# Patient Record
Sex: Male | Born: 1995 | Hispanic: No | Marital: Single | Smoking: Never smoker
Health system: Southern US, Community
[De-identification: ages and names within clinical notes are randomized; demographics above are authoritative.]

## PROBLEM LIST (undated history)

## (undated) DIAGNOSIS — F32A Depression, unspecified: Secondary | ICD-10-CM

## (undated) DIAGNOSIS — J45909 Unspecified asthma, uncomplicated: Secondary | ICD-10-CM

## (undated) HISTORY — DX: Depression, unspecified: F32.A

## (undated) HISTORY — DX: Unspecified asthma, uncomplicated: J45.909

---

## 2017-11-07 DIAGNOSIS — M940 Chondrocostal junction syndrome [Tietze]: Secondary | ICD-10-CM | POA: Insufficient documentation

## 2017-11-07 DIAGNOSIS — R300 Dysuria: Secondary | ICD-10-CM | POA: Insufficient documentation

## 2017-11-07 DIAGNOSIS — A539 Syphilis, unspecified: Secondary | ICD-10-CM | POA: Insufficient documentation

## 2017-11-07 DIAGNOSIS — R079 Chest pain, unspecified: Secondary | ICD-10-CM | POA: Insufficient documentation

## 2020-05-08 ENCOUNTER — Emergency Department
Admission: EM | Admit: 2020-05-08 | Discharge: 2020-05-08 | Disposition: A | Discharge status: Home / Self Care | Payer: Commercial Managed Care - PPO | Attending: Student in an Organized Health Care Education/Training Program | Admitting: Student in an Organized Health Care Education/Training Program

## 2020-05-08 ENCOUNTER — Other Ambulatory Visit: Payer: Self-pay

## 2020-05-08 ENCOUNTER — Emergency Department: Payer: Commercial Managed Care - PPO

## 2020-05-08 DIAGNOSIS — R1084 Generalized abdominal pain: Secondary | ICD-10-CM | POA: Insufficient documentation

## 2020-05-08 DIAGNOSIS — R1033 Periumbilical pain: Secondary | ICD-10-CM | POA: Insufficient documentation

## 2020-05-08 DIAGNOSIS — R197 Diarrhea, unspecified: Secondary | ICD-10-CM | POA: Insufficient documentation

## 2020-05-08 DIAGNOSIS — R509 Fever, unspecified: Secondary | ICD-10-CM | POA: Insufficient documentation

## 2020-05-08 DIAGNOSIS — M545 Low back pain, unspecified: Secondary | ICD-10-CM | POA: Diagnosis not present

## 2020-05-08 DIAGNOSIS — Z20822 Contact with and (suspected) exposure to covid-19: Secondary | ICD-10-CM | POA: Insufficient documentation

## 2020-05-08 DIAGNOSIS — K921 Melena: Secondary | ICD-10-CM | POA: Insufficient documentation

## 2020-05-08 LAB — CBC
HCT: 49.2 % (ref 39.0–52.0)
Hemoglobin: 17.2 g/dL — ABNORMAL HIGH (ref 13.0–17.0)
MCH: 31.7 pg (ref 26.0–34.0)
MCHC: 35 g/dL (ref 30.0–36.0)
MCV: 90.6 fL (ref 80.0–100.0)
Platelets: 258 10*3/uL (ref 150–400)
RBC: 5.43 MIL/uL (ref 4.22–5.81)
RDW: 11.8 % (ref 11.5–15.5)
WBC: 6.7 10*3/uL (ref 4.0–10.5)
nRBC: 0 % (ref 0.0–0.2)

## 2020-05-08 LAB — COMPREHENSIVE METABOLIC PANEL
ALT: 26 U/L (ref 0–44)
AST: 22 U/L (ref 15–41)
Albumin: 4.9 g/dL (ref 3.5–5.0)
Alkaline Phosphatase: 63 U/L (ref 38–126)
Anion gap: 8 (ref 5–15)
BUN: 16 mg/dL (ref 6–20)
CO2: 29 mmol/L (ref 22–32)
Calcium: 9.5 mg/dL (ref 8.9–10.3)
Chloride: 101 mmol/L (ref 98–111)
Creatinine, Ser: 0.79 mg/dL (ref 0.61–1.24)
GFR, Estimated: >60 mL/min (ref >60)
Glucose, Bld: 100 mg/dL — ABNORMAL HIGH (ref 70–99)
Potassium: 4.4 mmol/L (ref 3.5–5.1)
Sodium: 138 mmol/L (ref 135–145)
Total Bilirubin: 1.4 mg/dL — ABNORMAL HIGH (ref 0.3–1.2)
Total Protein: 7.8 g/dL (ref 6.5–8.1)

## 2020-05-08 LAB — URINALYSIS, COMPLETE (UACMP) WITH MICROSCOPIC
Bacteria, UA: NONE SEEN
Bilirubin Urine: NEGATIVE
Glucose, UA: NEGATIVE mg/dL
Hgb urine dipstick: NEGATIVE
Ketones, ur: NEGATIVE mg/dL
Leukocytes,Ua: NEGATIVE
Nitrite: NEGATIVE
Protein, ur: NEGATIVE mg/dL
Specific Gravity, Urine: 1.026 (ref 1.005–1.030)
pH: 7 (ref 5.0–8.0)

## 2020-05-08 LAB — RESPIRATORY PANEL BY RT PCR (FLU A&B, COVID)
Influenza A by PCR: NEGATIVE
Influenza B by PCR: NEGATIVE
SARS Coronavirus 2 by RT PCR: NEGATIVE

## 2020-05-08 LAB — LIPASE, BLOOD: Lipase: 30 U/L (ref 11–51)

## 2020-05-08 MED ORDER — ACETAMINOPHEN 500 MG PO TABS
1000.0000 mg | ORAL_TABLET | Freq: Once | ORAL | Status: AC
  Administered 2020-05-08: 1000 mg via ORAL
  Filled 2020-05-08: qty 2

## 2020-05-08 MED ORDER — SODIUM CHLORIDE 0.9 % IV BOLUS
1000.0000 mL | Freq: Once | INTRAVENOUS | Status: AC
  Administered 2020-05-08: 1000 mL via INTRAVENOUS

## 2020-05-08 MED ORDER — IOHEXOL 300 MG/ML  SOLN
100.0000 mL | Freq: Once | INTRAMUSCULAR | Status: AC | PRN
  Administered 2020-05-08: 100 mL via INTRAVENOUS
  Filled 2020-05-08: qty 100

## 2020-05-08 NOTE — Discharge Instructions (Signed)
You have been seen in the emergency department for emergency care. It is important that you contact your own doctor, specialist or the closest clinic for follow-up care. Please bring this instruction sheet, all medications and X-ray copies with you when you are seen for follow-up care.  Determining the exact cause for all patients with abdominal pain is extremely difficult in the emergency department. Our primary focus is to rule-out immediate life-threatening diseases. If no immediate source of pain is found the definitive diagnosis frequently needs to be determined over time.Many times your primary care physician can determine the cause by following the symptoms over time. Sometimes, specialist are required such as Gastroenterologists, Gynecologists, Urologists or Surgeons. Please return immediately to the Emergency Department for fever>101, Vomiting or Intractable Pain. You should return to the emergency department or see your primary care provider in 12-24hrs if your pain is no better and sooner if your pain becomes worse.  You have been seen and diagnosed with back pain today in the Emergency Department.  Medicines: Please take the medicine as prescribed. Call your primary healthcare provider if you think your medicine is not working as expected or if you feel you need more. I do not generally provide refills on pain medications in the ED because I don't monitor you on a long-term basis. Your primary care doctor does. Do not drive or operate heavy machinery while taking narcotic pain medications.  Self-care: Exercise: Gentle exercise may help decrease your pain. Start with some light exercises such as walking, biking, or swimming during the first 2 weeks. Ask for more information about the activities or exercises that are right for you. Maintain a healthy weight.  Ice: Ice helps decrease swelling, pain, and muscle spasm. Put crushed ice in a plastic bag. Cover it with a towel. Place it on your  lower back for 20 to 30 minutes every 2 hours until improvement.  Heat: Heat helps decrease pain and muscle spasms. Use a small towel dampened with warm water or a heat pad, or sit in a warm bath. Apply heat on the area for 20 to 30 minutes every 2 hours until improvement. Alternate heat and ice.  Physical therapy: You may need to see a physical therapist to teach you special exercises. These exercises help improve movement and decrease pain. Physical therapy can also help improve strength and decrease your risk for loss of function. Please follow-up with your PCP about physical therapy.  Contact your primary healthcare provider or orthopedist if:  You have a fever.  You have pain at night or when you rest.  Your pain does not get better with treatment.  You have pain that worsens when you cough, sneeze, or strain your back.  You suddenly feel something pop or snap in your back.  You have questions or concerns about your condition or care.  Please return to the emergency department if:  You have severe pain.  You have sudden stiffness or heaviness to buttocks down to both legs.  You have numbness or weakness in one leg, or pain in both legs.  You have numbness in your genital area or across your lower back.  You cannot control your urine or bowel movements.

## 2020-05-08 NOTE — ED Provider Notes (Signed)
[facility] Emergency Department Provider Note    First MD Initiated Contact with Patient 05/08/20 1215     (approximate)  I have reviewed the triage vital signs and the nursing notes.   HISTORY  Chief Complaint Abdominal Pain    HPI Trevor Bennett is a 24 y.o. male no significant past medical history presents to the ER for evaluation of generalized abdominal pain states primarily suprapubic as well as periumbilical pain radiating through to his back.  Is never had pain like this before.  Has been having multiple episodes of dark stool and diarrhea.  No vomiting.  Thinks that he has had some chills and low-grade fevers at home but no measured temperature.  Was seen at urgent care yesterday for same pain had reassuring work-up Covid was negative and was encouraged to come to the ER for further evaluation.  Patient went home thinking that symptoms would get better but they did continue to worsen so he presented here today.  No previous abdominal surgeries.  No history of stone.    History reviewed. No pertinent past medical history. No family history on file. History reviewed. No pertinent surgical history. There are no problems to display for this patient.     Prior to Admission medications   Not on File    Allergies Nitrates, organic and Oxycodone    Social History Social History   Tobacco Use  . Smoking status: Never Smoker  . Smokeless tobacco: Never Used  Substance Use Topics  . Alcohol use: Yes  . Drug use: Yes    Types: Marijuana    Review of Systems Patient denies headaches, rhinorrhea, blurry vision, numbness, shortness of breath, chest pain, edema, cough, abdominal pain, nausea, vomiting, diarrhea, dysuria, fevers, rashes or hallucinations unless otherwise stated above in HPI. ____________________________________________   PHYSICAL EXAM:  VITAL SIGNS: Vitals:   05/08/20 1051  BP: 125/83  Pulse: 75  Resp: 17  Temp: 98.1 F  (36.7 C)  SpO2: 98%    Constitutional: Alert and oriented.  Eyes: Conjunctivae are normal.  Head: Atraumatic. Nose: No congestion/rhinnorhea. Mouth/Throat: Mucous membranes are moist.   Neck: No stridor. Painless ROM.  Cardiovascular: Normal rate, regular rhythm. Grossly normal heart sounds.  Good peripheral circulation. Respiratory: Normal respiratory effort.  No retractions. Lungs CTAB. Gastrointestinal: Soft with mild periumbilical ttp, no guarding or rebound.  No distention. No abdominal bruits. No CVA tenderness. Genitourinary:  Musculoskeletal: No lower extremity tenderness nor edema.  No joint effusions. Neurologic:  Normal speech and language. No gross focal neurologic deficits are appreciated. No facial droop Skin:  Skin is warm, dry and intact. No rash noted. Psychiatric: Mood and affect are normal. Speech and behavior are normal.  ____________________________________________   LABS (all labs ordered are listed, but only abnormal results are displayed)  Results for orders placed or performed during the hospital encounter of 05/08/20 (from the past 24 hour(s))  Lipase, blood     Status: None   Collection Time: 05/08/20 10:54 AM  Result Value Ref Range   Lipase 30 11 - 51 U/L  Comprehensive metabolic panel     Status: Abnormal   Collection Time: 05/08/20 10:54 AM  Result Value Ref Range   Sodium 138 135 - 145 mmol/L   Potassium 4.4 3.5 - 5.1 mmol/L   Chloride 101 98 - 111 mmol/L   CO2 29 22 - 32 mmol/L   Glucose, Bld 100 (H) 70 - 99 mg/dL   BUN 16 6 - 20 mg/dL  Creatinine, Ser 0.79 0.61 - 1.24 mg/dL   Calcium 9.5 8.9 - 22.2 mg/dL   Total Protein 7.8 6.5 - 8.1 g/dL   Albumin 4.9 3.5 - 5.0 g/dL   AST 22 15 - 41 U/L   ALT 26 0 - 44 U/L   Alkaline Phosphatase 63 38 - 126 U/L   Total Bilirubin 1.4 (H) 0.3 - 1.2 mg/dL   GFR, Estimated >97 >98 mL/min   Anion gap 8 5 - 15  CBC     Status: Abnormal   Collection Time: 05/08/20 10:54 AM  Result Value Ref Range   WBC  6.7 4.0 - 10.5 K/uL   RBC 5.43 4.22 - 5.81 MIL/uL   Hemoglobin 17.2 (H) 13.0 - 17.0 g/dL   HCT 92.1 39 - 52 %   MCV 90.6 80.0 - 100.0 fL   MCH 31.7 26.0 - 34.0 pg   MCHC 35.0 30.0 - 36.0 g/dL   RDW 19.4 17.4 - 08.1 %   Platelets 258 150 - 400 K/uL   nRBC 0.0 0.0 - 0.2 %  Urinalysis, Complete w Microscopic     Status: Abnormal   Collection Time: 05/08/20 10:54 AM  Result Value Ref Range   Color, Urine YELLOW (A) YELLOW   APPearance HAZY (A) CLEAR   Specific Gravity, Urine 1.026 1.005 - 1.030   pH 7.0 5.0 - 8.0   Glucose, UA NEGATIVE NEGATIVE mg/dL   Hgb urine dipstick NEGATIVE NEGATIVE   Bilirubin Urine NEGATIVE NEGATIVE   Ketones, ur NEGATIVE NEGATIVE mg/dL   Protein, ur NEGATIVE NEGATIVE mg/dL   Nitrite NEGATIVE NEGATIVE   Leukocytes,Ua NEGATIVE NEGATIVE   RBC / HPF 0-5 0 - 5 RBC/hpf   WBC, UA 0-5 0 - 5 WBC/hpf   Bacteria, UA NONE SEEN NONE SEEN   Squamous Epithelial / LPF 0-5 0 - 5   Mucus PRESENT    ____________________________________________ ____________________________________________  RADIOLOGY  I personally reviewed all radiographic images ordered to evaluate for the above acute complaints and reviewed radiology reports and findings.  These findings were personally discussed with the patient.  Please see medical record for radiology report.  ____________________________________________   PROCEDURES  Procedure(s) performed:  Procedures    Critical Care performed: no ____________________________________________   INITIAL IMPRESSION / ASSESSMENT AND PLAN / ED COURSE  Pertinent labs & imaging results that were available during my care of the patient were reviewed by me and considered in my medical decision making (see chart for details).   DDX: Appendicitis, cystitis, Pilo, stone, musculoskeletal strain, aneurysm, colitis, IBD, cauda equina, epidural abscess  Trevor Bennett is a 24 y.o. who presents to the ED with presentation as described above.  Patient  uncomfortable nontoxic-appearing.  Will give IV fluids.  Patient declining any pain medication at this time.  Blood work is reassuring but given location of pain with periumbilical pain rating through to his back will order CT imaging due to concern for the but differential.  Will reassess.  Clinical Course as of May 08 1600  Fri May 08, 2020  1441 Patient still quite uncomfortable appearing reporting no family members who that he did have temperature to 101 yesterday.  Will repeat Covid.  Question whether some of his urinary frequency symptoms could be related to CNS pathology.  He is not showing any meningismus.  No complaint of any headache.  Blood work is otherwise reassuring.  CT imaging without any evidence of acute appendicitis but given his low back pain without any history of trauma  with reported fevers at home will order MRI to evaluate for epidural abscess or discitis.   [PR]  1601 Patient reassessed.  He is declining any pain medication.  MRI does not show any evidence of acute process.  I do not have a great explanation for his symptoms.  May simply been musculoskeletal strain.  Given reassuring work-up at this point I do believe he is appropriate for outpatient referral.   [PR]    Clinical Course User Index [PR] Willy Eddy, MD    The patient was evaluated in Emergency Department today for the symptoms described in the history of present illness. He/she was evaluated in the context of the global COVID-19 pandemic, which necessitated consideration that the patient might be at risk for infection with the SARS-CoV-2 virus that causes COVID-19. Institutional protocols and algorithms that pertain to the evaluation of patients at risk for COVID-19 are in a state of rapid change based on information released by regulatory bodies including the CDC and federal and state organizations. These policies and algorithms were followed during the patient's care in the ED.  As part of my medical  decision making, I reviewed the following data within the electronic MEDICAL RECORD NUMBER Nursing notes reviewed and incorporated, Labs reviewed, notes from prior ED visits and [place] Controlled Substance Database   ____________________________________________   FINAL CLINICAL IMPRESSION(S) / ED DIAGNOSES  Final diagnoses:  Periumbilical abdominal pain  Acute midline low back pain without sciatica      NEW MEDICATIONS STARTED DURING THIS VISIT:  New Prescriptions   No medications on file     Note:  This document was prepared using Dragon voice recognition software and may include unintentional dictation errors.    Willy Eddy, MD 05/08/20 [id number]

## 2020-05-08 NOTE — ED Triage Notes (Signed)
Pt c/o lower abd pain that radiates into the back for the past 2 days , was seen at [facility] yesterday  For the same. Denies N/V/Dfever

## 2020-09-29 ENCOUNTER — Encounter: Payer: Self-pay | Admitting: Emergency Medicine

## 2020-09-29 ENCOUNTER — Other Ambulatory Visit: Payer: Self-pay

## 2020-09-29 ENCOUNTER — Emergency Department: Payer: Commercial Managed Care - PPO

## 2020-09-29 ENCOUNTER — Emergency Department
Admission: EM | Admit: 2020-09-29 | Discharge: 2020-09-29 | Disposition: A | Discharge status: Home / Self Care | Payer: Commercial Managed Care - PPO | Attending: Emergency Medicine | Admitting: Emergency Medicine

## 2020-09-29 DIAGNOSIS — M545 Low back pain, unspecified: Secondary | ICD-10-CM | POA: Insufficient documentation

## 2020-09-29 DIAGNOSIS — R112 Nausea with vomiting, unspecified: Secondary | ICD-10-CM | POA: Diagnosis not present

## 2020-09-29 DIAGNOSIS — R3129 Other microscopic hematuria: Secondary | ICD-10-CM | POA: Diagnosis not present

## 2020-09-29 DIAGNOSIS — R1032 Left lower quadrant pain: Secondary | ICD-10-CM | POA: Insufficient documentation

## 2020-09-29 LAB — COMPREHENSIVE METABOLIC PANEL
ALT: 24 U/L (ref 0–44)
AST: 24 U/L (ref 15–41)
Albumin: 4.8 g/dL (ref 3.5–5.0)
Alkaline Phosphatase: 62 U/L (ref 38–126)
Anion gap: 7 (ref 5–15)
BUN: 14 mg/dL (ref 6–20)
CO2: 26 mmol/L (ref 22–32)
Calcium: 9.5 mg/dL (ref 8.9–10.3)
Chloride: 107 mmol/L (ref 98–111)
Creatinine, Ser: 0.76 mg/dL (ref 0.61–1.24)
GFR, Estimated: >60 mL/min (ref >60)
Glucose, Bld: 104 mg/dL — ABNORMAL HIGH (ref 70–99)
Potassium: 4.3 mmol/L (ref 3.5–5.1)
Sodium: 140 mmol/L (ref 135–145)
Total Bilirubin: 1.3 mg/dL — ABNORMAL HIGH (ref 0.3–1.2)
Total Protein: 7.9 g/dL (ref 6.5–8.1)

## 2020-09-29 LAB — CBC
HCT: 48.4 % (ref 39.0–52.0)
Hemoglobin: 16.3 g/dL (ref 13.0–17.0)
MCH: 31.5 pg (ref 26.0–34.0)
MCHC: 33.7 g/dL (ref 30.0–36.0)
MCV: 93.4 fL (ref 80.0–100.0)
Platelets: 247 10*3/uL (ref 150–400)
RBC: 5.18 MIL/uL (ref 4.22–5.81)
RDW: 12.5 % (ref 11.5–15.5)
WBC: 6.1 10*3/uL (ref 4.0–10.5)
nRBC: 0 % (ref 0.0–0.2)

## 2020-09-29 LAB — URINALYSIS, COMPLETE (UACMP) WITH MICROSCOPIC
Bacteria, UA: NONE SEEN
Bilirubin Urine: NEGATIVE
Glucose, UA: NEGATIVE mg/dL
Ketones, ur: NEGATIVE mg/dL
Leukocytes,Ua: NEGATIVE
Nitrite: NEGATIVE
Protein, ur: NEGATIVE mg/dL
Specific Gravity, Urine: 1.018 (ref 1.005–1.030)
pH: 6 (ref 5.0–8.0)

## 2020-09-29 LAB — LIPASE, BLOOD: Lipase: 27 U/L (ref 11–51)

## 2020-09-29 LAB — CK: Total CK: 105 U/L (ref 49–397)

## 2020-09-29 NOTE — ED Triage Notes (Addendum)
Pt via POV from home. Pt c/o lower abdominal pain for about 2 month that radiates to his back. Pt states he went to [facility] and they told him he had blood in his urine and was told to come here. Pt also started having some nausea today but [facility] gave him Zofran and pt denies nausea now. Denies any abdominal surgeries. Denies any other urinary symptoms. Pt is A&Ox4 and NAD.

## 2020-09-29 NOTE — ED Provider Notes (Signed)
[facility] Emergency Department Provider Note  ____________________________________________   Event Date/Time   First MD Initiated Contact with Patient 09/29/20 1228     (approximate)  I have reviewed the triage vital signs and the nursing notes.   HISTORY  Chief Complaint Abdominal Pain, Nausea, and Hematuria    HPI Trevor Bennett is a 25 y.o. male otherwise healthy although does have recurrent UTIs who comes in with left flank pain.  Patient reports having left flank pain for the past 2 weeks, intermittent, nothing makes better, nothing makes it worse.  He states that he came in today because he noted a little bit more discomfort with urination.  He went to the urgent care and there was concern for microscopic blood in his urine.  Denies noticing any hematuria but states he does not drink a lot of water and has noticed it was darker in color.  Denies any fevers.  Did have some nausea and episode of vomiting          History reviewed. No pertinent past medical history.  There are no problems to display for this patient.   History reviewed. No pertinent surgical history.  Prior to Admission medications   Not on File    Allergies Nitrates, organic and Oxycodone  History reviewed. No pertinent family history.  Social History Social History   Tobacco Use  . Smoking status: Never Smoker  . Smokeless tobacco: Never Used  Substance Use Topics  . Alcohol use: Yes  . Drug use: Yes    Types: Marijuana      Review of Systems Constitutional: No fever/chills Eyes: No visual changes. ENT: No sore throat. Cardiovascular: Denies chest pain. Respiratory: Denies shortness of breath. Gastrointestinal: Left lower quadrant pain, nausea, vomiting, Genitourinary: Mild discomfort with urination Musculoskeletal: Left back pain Skin: Negative for rash. Neurological: Negative for headaches, focal weakness or numbness. All other ROS  negative ____________________________________________   PHYSICAL EXAM:  VITAL SIGNS: ED Triage Vitals [09/29/20 1207]  Enc Vitals Group     BP (!) 130/95     Pulse Rate 87     Resp 20     Temp 98.4 F (36.9 C)     Temp Source Oral     SpO2 100 %     Weight 142 lb (64.4 kg)     Height 5\' 8"  (1.727 m)     Head Circumference      Peak Flow      Pain Score 5     Pain Loc      Pain Edu?      Excl. in GC?     Constitutional: Alert and oriented. Well appearing and in no acute distress. Eyes: Conjunctivae are normal. EOMI. Head: Atraumatic. Nose: No congestion/rhinnorhea. Mouth/Throat: Mucous membranes are moist.   Neck: No stridor. Trachea Midline. FROM Cardiovascular: Normal rate, regular rhythm. Grossly normal heart sounds.  Good peripheral circulation. Respiratory: Normal respiratory effort.  No retractions. Lungs CTAB. Gastrointestinal: Soft with some mild left lower quadrant tenderness. No distention. No abdominal bruits.  Musculoskeletal: No lower extremity tenderness nor edema.  No joint effusions. Neurologic:  Normal speech and language. No gross focal neurologic deficits are appreciated.  Skin:  Skin is warm, dry and intact. No rash noted. Psychiatric: Mood and affect are normal. Speech and behavior are normal. GU: No erythema or redness on the testicles, circumcised penis, positive cremaster reflex bilaterally Back: Positive CVA tenderness ____________________________________________   LABS (all labs ordered are listed, but only  abnormal results are displayed)  Labs Reviewed  COMPREHENSIVE METABOLIC PANEL - Abnormal; Notable for the following components:      Result Value   Glucose, Bld 104 (*)    Total Bilirubin 1.3 (*)    All other components within normal limits  URINALYSIS, COMPLETE (UACMP) WITH MICROSCOPIC - Abnormal; Notable for the following components:   Color, Urine YELLOW (*)    APPearance CLEAR (*)    Hgb urine dipstick SMALL (*)    All other  components within normal limits  LIPASE, BLOOD  CBC  CK   ____________________________________________  RADIOLOGY Vela Prose, personally viewed and evaluated these images (plain radiographs) as part of my medical decision making, as well as reviewing the written report by the radiologist.  ED MD interpretation: No kidney stones noted  Official radiology report(s): CT Renal Stone Study  Result Date: 09/29/2020 CLINICAL DATA:  Hematuria. EXAM: CT ABDOMEN AND PELVIS WITHOUT CONTRAST TECHNIQUE: Multidetector CT imaging of the abdomen and pelvis was performed following the standard protocol without IV contrast. COMPARISON:  05/08/2020 FINDINGS: Lower chest: No acute abnormality. Hepatobiliary: Visualized portions of the liver are unremarkable. Gallbladder appears normal. No biliary dilatation. Pancreas: Unremarkable. No pancreatic ductal dilatation or surrounding inflammatory changes. Spleen: Normal in size without focal abnormality. Adrenals/Urinary Tract: Normal appearance of the adrenal glands. The kidneys are unremarkable. No urinary tract calculi identified bilaterally. No signs of hydronephrosis. Urinary bladder is unremarkable. Stomach/Bowel: Stomach appears normal. The appendix is visualized and appears normal. No bowel wall thickening, inflammation, or distension. Vascular/Lymphatic: No significant vascular findings are present. No enlarged abdominal or pelvic lymph nodes. Reproductive: Prostate is unremarkable. Other: No free fluid or fluid collections. Musculoskeletal: No acute or significant osseous findings. IMPRESSION: No acute findings within the abdomen or pelvis. No urinary tract calculi identified. Electronically Signed   By: Signa Kell M.D.   On: 09/29/2020 13:26    ____________________________________________   PROCEDURES  Procedure(s) performed (including Critical Care):  Procedures   ____________________________________________   INITIAL IMPRESSION /  ASSESSMENT AND PLAN / ED COURSE  Trevor Bennett was evaluated in Emergency Department on 09/29/2020 for the symptoms described in the history of present illness. He was evaluated in the context of the global COVID-19 pandemic, which necessitated consideration that the patient might be at risk for infection with the SARS-CoV-2 virus that causes COVID-19. Institutional protocols and algorithms that pertain to the evaluation of patients at risk for COVID-19 are in a state of rapid change based on information released by regulatory bodies including the CDC and federal and state organizations. These policies and algorithms were followed during the patient's care in the ED.    Patient is a 25 year old who comes in with blood in his urine has microscopic as well as left flank pain is been going on for 2 months.  Will get CT renal evaluate for kidney stone, obstruction, colitis.  No right lower quadrant pain to suggest appendicitis.  Labs ordered to evaluate Electra abnormalities, AKI.  His repeat urine here shows small hemoglobin but 0-5 RBCs.  Will add on CK to evaluate for rhabdo  No evidence of UTI patient already had gonorrhea and chlamydia swabs that are pending from urgent care.  Declines retest here.  Patient states he feels like he is getting good amounts of urine out and low suspicion for retention at this time.  He does report having unprotected sex 1 time and explained that he needs to follow-up with the urgent care testing.  He already  had HIV test that was negative.  Labs show a slightly elevated T bili similar to prior.  I discussed this with patient and told her to follow-up with PCP and cut down alcohol if he drinks alcohol.  CK level was normal therefore evidence of rhabdo.  Discussed with patient that his CT scan was negative.  His urine showed small amount of hemoglobin in it.  We discussed increasing fluid intake and having this rechecked with his primary care doctor in 1 to 2 weeks.  If  continues to have blood in it he will need to follow-up with urology for further work-up.  Patient expressed understanding felt comfortable with discharge home       ____________________________________________   FINAL CLINICAL IMPRESSION(S) / ED DIAGNOSES   Final diagnoses:  Low back pain without sciatica, unspecified back pain laterality, unspecified chronicity  Microscopic hematuria      MEDICATIONS GIVEN DURING THIS VISIT:  Medications - No data to display   ED Discharge Orders    None       Note:  This document was prepared using Dragon voice recognition software and may include unintentional dictation errors.   Concha Se, MD 09/29/20 1410

## 2020-09-29 NOTE — Discharge Instructions (Signed)
Your urine showed a small amount of hemoglobin and it.  However your CT scan was negative for kidney stone and there is no signs of something called rhabdomyolysis.  You should however drink plenty of fluids.  Your liver function test was also slightly elevated which is similar to previous.  Nothing that is causing your pain but something that you should follow-up with your primary care doctor and if you drink alcohol you should cut down.  You should follow-up with your primary care doctor in a few weeks to have your urine retested to make sure that there is no longer any blood in it.  If he continues to have blood in it he may need to follow-up with a urologist  Take Tylenol 1 g every 8 hours and ibuprofen 600 with food to help with any pain

## 2020-11-12 ENCOUNTER — Other Ambulatory Visit: Payer: Self-pay

## 2020-11-17 ENCOUNTER — Other Ambulatory Visit: Payer: Self-pay

## 2020-11-17 ENCOUNTER — Other Ambulatory Visit (INDEPENDENT_AMBULATORY_CARE_PROVIDER_SITE_OTHER): Payer: Commercial Managed Care - PPO

## 2020-11-17 ENCOUNTER — Encounter: Payer: Self-pay | Admitting: Adult Health

## 2020-11-17 ENCOUNTER — Ambulatory Visit: Payer: Commercial Managed Care - PPO | Admitting: Adult Health

## 2020-11-17 VITALS — BP 108/64 | HR 93 | Temp 97.5°F | Resp 14 | Ht 67.0 in | Wt 140.4 lb

## 2020-11-17 DIAGNOSIS — Z87448 Personal history of other diseases of urinary system: Secondary | ICD-10-CM | POA: Diagnosis not present

## 2020-11-17 DIAGNOSIS — Z87440 Personal history of urinary (tract) infections: Secondary | ICD-10-CM | POA: Diagnosis not present

## 2020-11-17 DIAGNOSIS — R5383 Other fatigue: Secondary | ICD-10-CM

## 2020-11-17 DIAGNOSIS — Z87898 Personal history of other specified conditions: Secondary | ICD-10-CM

## 2020-11-17 LAB — COMPREHENSIVE METABOLIC PANEL
ALT: 19 U/L (ref 0–53)
AST: 19 U/L (ref 0–37)
Albumin: 4.4 g/dL (ref 3.5–5.2)
Alkaline Phosphatase: 68 U/L (ref 39–117)
BUN: 17 mg/dL (ref 6–23)
CO2: 30 mEq/L (ref 19–32)
Calcium: 9.4 mg/dL (ref 8.4–10.5)
Chloride: 101 mEq/L (ref 96–112)
Creatinine, Ser: 0.79 mg/dL (ref 0.40–1.50)
GFR: 124.21 mL/min (ref >60.00)
Glucose, Bld: 84 mg/dL (ref 70–99)
Potassium: 4.3 mEq/L (ref 3.5–5.1)
Sodium: 138 mEq/L (ref 135–145)
Total Bilirubin: 0.9 mg/dL (ref 0.2–1.2)
Total Protein: 7.3 g/dL (ref 6.0–8.3)

## 2020-11-17 LAB — CBC WITH DIFFERENTIAL/PLATELET
Basophils Absolute: 0 10*3/uL (ref 0.0–0.1)
Basophils Relative: 0.4 % (ref 0.0–3.0)
Eosinophils Absolute: 0.2 10*3/uL (ref 0.0–0.7)
Eosinophils Relative: 2.8 % (ref 0.0–5.0)
HCT: 47.3 % (ref 39.0–52.0)
Hemoglobin: 16.1 g/dL (ref 13.0–17.0)
Lymphocytes Relative: 28.5 % (ref 12.0–46.0)
Lymphs Abs: 1.9 10*3/uL (ref 0.7–4.0)
MCHC: 34.1 g/dL (ref 30.0–36.0)
MCV: 91.7 fl (ref 78.0–100.0)
Monocytes Absolute: 0.7 10*3/uL (ref 0.1–1.0)
Monocytes Relative: 10.3 % (ref 3.0–12.0)
Neutro Abs: 3.8 10*3/uL (ref 1.4–7.7)
Neutrophils Relative %: 58 % (ref 43.0–77.0)
Platelets: 295 10*3/uL (ref 150.0–400.0)
RBC: 5.16 Mil/uL (ref 4.22–5.81)
RDW: 12.6 % (ref 11.5–15.5)
WBC: 6.6 10*3/uL (ref 4.0–10.5)

## 2020-11-17 LAB — URINALYSIS
Bilirubin Urine: NEGATIVE
Ketones, ur: NEGATIVE
Leukocytes,Ua: NEGATIVE
Nitrite: NEGATIVE
Specific Gravity, Urine: 1.015 (ref 1.000–1.030)
Total Protein, Urine: NEGATIVE
Urine Glucose: NEGATIVE
Urobilinogen, UA: 0.2 (ref 0.0–1.0)
pH: 8.5 — AB (ref 5.0–8.0)

## 2020-11-17 LAB — LIPID PANEL
Cholesterol: 157 mg/dL (ref 0–200)
HDL: 50.1 mg/dL (ref >39.00)
LDL Cholesterol: 90 mg/dL (ref 0–99)
NonHDL: 106.85
Total CHOL/HDL Ratio: 3
Triglycerides: 85 mg/dL (ref 0.0–149.0)
VLDL: 17 mg/dL (ref 0.0–40.0)

## 2020-11-17 LAB — URINALYSIS, MICROSCOPIC ONLY: RBC / HPF: NONE SEEN (ref 0–?)

## 2020-11-17 LAB — TSH: TSH: 1.75 u[IU]/mL (ref 0.35–4.50)

## 2020-11-17 NOTE — Patient Instructions (Signed)
Hematuria, Adult Hematuria is blood in the urine. Blood may be visible in the urine, or it may be identified with a test. This condition can be caused by infections of the bladder, urethra, kidney, or prostate. Other possible causes include:  Kidney stones.  Cancer of the urinary tract.  Too much calcium in the urine.  Conditions that are passed from parent to child (inherited conditions).  Exercise that requires a lot of energy. Infections can usually be treated with medicine, and a kidney stone usually will pass through your urine. If neither of these is the cause of your hematuria, more tests may be needed to identify the cause of your symptoms. It is very important to tell your health care provider about any blood in your urine, even if it is painless or the blood stops without treatment. Blood in the urine, when it happens and then stops and then happens again, can be a symptom of a very serious condition, including cancer. There is no pain in the initial stages of many urinary cancers. Follow these instructions at home: Medicines  Take over-the-counter and prescription medicines only as told by your health care provider.  If you were prescribed an antibiotic medicine, take it as told by your health care provider. Do not stop taking the antibiotic even if you start to feel better. Eating and drinking  Drink enough fluid to keep your urine pale yellow. It is recommended that you drink 3-4 quarts (2.8-3.8 L) a day. If you have been diagnosed with an infection, drinking cranberry juice in addition to large amounts of water is recommended.  Avoid caffeine, tea, and carbonated beverages. These tend to irritate the bladder.  Avoid alcohol because it may irritate the prostate (in males). General instructions  If you have been diagnosed with a kidney stone, follow your health care provider's instructions about straining your urine to catch the stone.  Empty your bladder often. Avoid  holding urine for long periods of time.  If you are male: ? After a bowel movement, wipe from front to back and use each piece of toilet paper only once. ? Empty your bladder before and after sex.  Pay attention to any changes in your symptoms. Tell your health care provider about any changes or any new symptoms.  It is up to you to get the results of any tests. Ask your health care provider, or the department that is doing the test, when your results will be ready.  Keep all follow-up visits. This is important. Contact a health care provider if:  You develop back pain.  You have a fever or chills.  You have nausea or vomiting.  Your symptoms do not improve after 3 days.  Your symptoms get worse. Get help right away if:  You develop severe vomiting and are unable to take medicine without vomiting.  You develop severe pain in your back or abdomen even though you are taking medicine.  You pass a large amount of blood in your urine.  You pass blood clots in your urine.  You feel very weak or like you might faint.  You faint. Summary  Hematuria is blood in the urine. It has many possible causes.  It is very important that you tell your health care provider about any blood in your urine, even if it is painless or the blood stops without treatment.  Take over-the-counter and prescription medicines only as told by your health care provider.  Drink enough fluid to keep your urine pale   yellow. This information is not intended to replace advice given to you by your health care provider. Make sure you discuss any questions you have with your health care provider. Document Revised: 03/04/2020 Document Reviewed: 03/04/2020 Elsevier Patient Education  2021 [employer]. Health Maintenance, Male Adopting a healthy lifestyle and getting preventive care are important in promoting health and wellness. Ask your health care provider about:  The right schedule for you to have regular  tests and exams.  Things you can do on your own to prevent diseases and keep yourself healthy. What should I know about diet, weight, and exercise? Eat a healthy diet  Eat a diet that includes plenty of vegetables, fruits, low-fat dairy products, and lean protein.  Do not eat a lot of foods that are high in solid fats, added sugars, or sodium.   Maintain a healthy weight Body mass index (BMI) is a measurement that can be used to identify possible weight problems. It estimates body fat based on height and weight. Your health care provider can help determine your BMI and help you achieve or maintain a healthy weight. Get regular exercise Get regular exercise. This is one of the most important things you can do for your health. Most adults should:  Exercise for at least 150 minutes each week. The exercise should increase your heart rate and make you sweat (moderate-intensity exercise).  Do strengthening exercises at least twice a week. This is in addition to the moderate-intensity exercise.  Spend less time sitting. Even light physical activity can be beneficial. Watch cholesterol and blood lipids Have your blood tested for lipids and cholesterol at 25 years of age, then have this test every 5 years. You may need to have your cholesterol levels checked more often if:  Your lipid or cholesterol levels are high.  You are older than 25 years of age.  You are at high risk for heart disease. What should I know about cancer screening? Many types of cancers can be detected early and may often be prevented. Depending on your health history and family history, you may need to have cancer screening at various ages. This may include screening for:  Colorectal cancer.  Prostate cancer.  Skin cancer.  Lung cancer. What should I know about heart disease, diabetes, and high blood pressure? Blood pressure and heart disease  High blood pressure causes heart disease and increases the risk of  stroke. This is more likely to develop in people who have high blood pressure readings, are of African descent, or are overweight.  Talk with your health care provider about your target blood pressure readings.  Have your blood pressure checked: ? Every 3-5 years if you are 49-69 years of age. ? Every year if you are 25 years old or older.  If you are between the ages of 62 and 48 and are a current or former smoker, ask your health care provider if you should have a one-time screening for abdominal aortic aneurysm (AAA). Diabetes Have regular diabetes screenings. This checks your fasting blood sugar level. Have the screening done:  Once every three years after age 64 if you are at a normal weight and have a low risk for diabetes.  More often and at a younger age if you are overweight or have a high risk for diabetes. What should I know about preventing infection? Hepatitis B If you have a higher risk for hepatitis B, you should be screened for this virus. Talk with your health care provider to  find out if you are at risk for hepatitis B infection. Hepatitis C Blood testing is recommended for:  Everyone born from [birth year] through 1965.  Anyone with known risk factors for hepatitis C. Sexually transmitted infections (STIs)  You should be screened each year for STIs, including gonorrhea and chlamydia, if: ? You are sexually active and are younger than 25 years of age. ? You are older than 25 years of age and your health care provider tells you that you are at risk for this type of infection. ? Your sexual activity has changed since you were last screened, and you are at increased risk for chlamydia or gonorrhea. Ask your health care provider if you are at risk.  Ask your health care provider about whether you are at high risk for HIV. Your health care provider may recommend a prescription medicine to help prevent HIV infection. If you choose to take medicine to prevent HIV, you should first  get tested for HIV. You should then be tested every 3 months for as long as you are taking the medicine. Follow these instructions at home: Lifestyle  Do not use any products that contain nicotine or tobacco, such as cigarettes, e-cigarettes, and chewing tobacco. If you need help quitting, ask your health care provider.  Do not use street drugs.  Do not share needles.  Ask your health care provider for help if you need support or information about quitting drugs. Alcohol use  Do not drink alcohol if your health care provider tells you not to drink.  If you drink alcohol: ? Limit how much you have to 0-2 drinks a day. ? Be aware of how much alcohol is in your drink. In the [place] one drink equals one 12 oz bottle of beer (355 mL), one 5 oz glass of wine (148 mL), or one 1 oz glass of hard liquor (44 mL). General instructions  Schedule regular health, dental, and eye exams.  Stay current with your vaccines.  Tell your health care provider if: ? You often feel depressed. ? You have ever been abused or do not feel safe at home. Summary  Adopting a healthy lifestyle and getting preventive care are important in promoting health and wellness.  Follow your health care provider's instructions about healthy diet, exercising, and getting tested or screened for diseases.  Follow your health care provider's instructions on monitoring your cholesterol and blood pressure. This information is not intended to replace advice given to you by your health care provider. Make sure you discuss any questions you have with your health care provider. Document Revised: 06/27/2018 Document Reviewed: 06/27/2018 Elsevier Patient Education  2021 [employer].

## 2020-11-17 NOTE — Addendum Note (Signed)
Addended by: Warden Fillers on: 11/17/2020 02:14 PM   Modules accepted: Orders

## 2020-11-17 NOTE — Progress Notes (Unsigned)
urine

## 2020-11-17 NOTE — Progress Notes (Signed)
New Patient Office Visit  Subjective:  Patient ID: Trevor Bennett, male    DOB: [birth year]  Age: 25 y.o. MRN: [id number]  CC:  Chief Complaint  Patient presents with  . Establish Care    New Patient    HPI Trevor Bennett presents for establishment of care. He reports he has had history of urinary tract infections, reports has had 3 since the past 2 years. He has not had any since breaking up with his girlfriend. Denies any pain with urination. Denies any problems with rectal pain, pressure or with getting an erection and or ejaculation.   He denies any need for STD testing.  Last UTI was October 2021 and was treated and resolved.  Provider does see small amount of blood in urine one month ago in emergency room. CT renal study was negative.   Denies any urinary symptoms now. Denies any gross hematuria.   Patient  denies any fever, body aches,chills, rash, chest pain, shortness of breath, nausea, vomiting, or diarrhea.  Denies dizziness, lightheadedness, pre syncopal or syncopal episodes.    Past Medical History:  Diagnosis Date  . Asthma   . Depression     History reviewed. No pertinent surgical history.  Family History  Problem Relation Age of Onset  . Alcohol abuse Father   . Heart attack Father     Social History   Socioeconomic History  . Marital status: Single    Spouse name: Not on file  . Number of children: Not on file  . Years of education: Not on file  . Highest education level: Not on file  Occupational History  . Not on file  Tobacco Use  . Smoking status: Never Smoker  . Smokeless tobacco: Never Used  Substance and Sexual Activity  . Alcohol use: Yes  . Drug use: Yes    Types: Marijuana  . Sexual activity: Yes    Comment: with male and male  Other Topics Concern  . Not on file  Social History Narrative  . Not on file   Social Determinants of Health   Financial Resource Strain: Not on file  Food Insecurity: Not on file  Transportation Needs:  Not on file  Physical Activity: Not on file  Stress: Not on file  Social Connections: Not on file  Intimate Partner Violence: Not on file    ROS Review of Systems  Constitutional: Negative.   HENT: Negative.   Respiratory: Negative.   Cardiovascular: Negative.   Genitourinary: Negative.   Musculoskeletal: Negative.   Neurological: Negative.   Hematological: Negative.   Psychiatric/Behavioral: Negative.     Objective:   Today's Vitals: BP 108/64 (BP Location: Left Arm, Patient Position: Sitting, Cuff Size: Normal)   Pulse 93   Temp (!) 97.5 F (36.4 C) (Temporal)   Resp 14   Ht 5\' 7"  (1.702 m)   Wt 140 lb 6.4 oz (63.7 kg)   SpO2 97%   BMI 21.99 kg/m   Physical Exam Vitals and nursing note reviewed.  Constitutional:      Appearance: Normal appearance. He is not ill-appearing or toxic-appearing.  HENT:     Head: Normocephalic and atraumatic.     Right Ear: External ear normal.     Left Ear: External ear normal.     Nose: Nose normal.     Mouth/Throat:     Mouth: Mucous membranes are moist.     Pharynx: No oropharyngeal exudate or posterior oropharyngeal erythema.  Eyes:     General: No  scleral icterus.       Right eye: No discharge.        Left eye: No discharge.     Extraocular Movements: Extraocular movements intact.     Conjunctiva/sclera: Conjunctivae normal.  Cardiovascular:     Rate and Rhythm: Normal rate and regular rhythm.     Pulses: Normal pulses.     Heart sounds: Normal heart sounds. No murmur heard. No friction rub. No gallop.   Pulmonary:     Effort: Pulmonary effort is normal. No respiratory distress.     Breath sounds: Normal breath sounds. No stridor. No wheezing, rhonchi or rales.  Chest:     Chest wall: No tenderness.  Abdominal:     General: There is no distension.     Palpations: Abdomen is soft.     Tenderness: There is no abdominal tenderness.  Musculoskeletal:        General: Normal range of motion.     Cervical back: Normal  range of motion and neck supple.     Right lower leg: No edema.     Left lower leg: No edema.  Skin:    General: Skin is warm.     Findings: No lesion.  Neurological:     General: No focal deficit present.     Mental Status: He is alert and oriented to person, place, and time.     Motor: No weakness.     Gait: Gait normal.  Psychiatric:        Mood and Affect: Mood normal.        Behavior: Behavior normal.        Thought Content: Thought content normal.        Judgment: Judgment normal.     Assessment & Plan:   Problem List Items Addressed This Visit   None   Visit Diagnoses    History of UTI    -  Primary   Relevant Orders   Urinalysis   CULTURE, URINE COMPREHENSIVE   History of blood in urine       Relevant Orders   Urinalysis   CULTURE, URINE COMPREHENSIVE   Fatigue, unspecified type       Relevant Orders   CBC with Differential/Platelet   Comprehensive metabolic panel   TSH   Lipid panel     Orders Placed This Encounter  Procedures  . CULTURE, URINE COMPREHENSIVE  . CBC with Differential/Platelet  . Comprehensive metabolic panel  . TSH  . Lipid panel  . Urinalysis  will check microscopic urine and culture. Urology referral is appropriate if still showing blood. Declined any STD testing.   Red Flags discussed. The patient was given clear instructions to go to ER or return to medical center if any red flags develop, symptoms do not improve, worsen or new problems develop. They verbalized understanding.  .  No outpatient encounter medications on file as of 11/17/2020.   No facility-administered encounter medications on file as of 11/17/2020.    Follow-up: Return in about 3 months (around 02/17/2021), or if symptoms worsen or fail to improve, for Go to Emergency room/ urgent care if worse.   Jairo Ben, FNP

## 2020-11-17 NOTE — Addendum Note (Signed)
Addended by: Hulan Fray on: 11/17/2020 01:41 PM   Modules accepted: Orders

## 2020-11-18 ENCOUNTER — Telehealth: Payer: Self-pay | Admitting: Adult Health

## 2020-11-18 NOTE — Telephone Encounter (Signed)
Returned the call and spoke to [employer]. See result notes.

## 2020-11-18 NOTE — Progress Notes (Signed)
CMP and CMP is within normal limits.  Cholesterol within normal limits.  TSH for thyroid within normal limits.  Urinalysis did show trace hemoglobin/ blood in urine and urine microscopic and urine culture are still pending we will wait for those results.

## 2020-11-18 NOTE — Telephone Encounter (Signed)
Pt called back about lab results.  

## 2020-11-19 LAB — CULTURE, URINE COMPREHENSIVE
MICRO NUMBER:: 11843730
RESULT:: NO GROWTH
SPECIMEN QUALITY:: ADEQUATE

## 2020-11-19 NOTE — Progress Notes (Signed)
Urine culture still in progress as of 11/19/2020

## 2020-11-23 ENCOUNTER — Other Ambulatory Visit: Payer: Self-pay | Admitting: Adult Health

## 2020-11-23 DIAGNOSIS — R311 Benign essential microscopic hematuria: Secondary | ICD-10-CM | POA: Insufficient documentation

## 2020-11-23 NOTE — Progress Notes (Signed)
Orders Placed This Encounter Procedures  Ambulatory referral to Urology Placed order for urology consult, hematuria history on microscopic and no infection noted.  He should hear from urologist within 2 weeks and if not please call the office.

## 2020-11-23 NOTE — Progress Notes (Signed)
Orders Placed This Encounter  Procedures  . Ambulatory referral to Urology

## 2020-12-16 ENCOUNTER — Ambulatory Visit: Payer: Self-pay | Admitting: Urology

## 2020-12-16 NOTE — Progress Notes (Deleted)
12/16/2020 8:18 AM   Trevor Bennett [birth year] [id number]  Referring provider: Berniece Pap, FNP [address]  [place] [address]  No chief complaint on file.   HPI: Trevor Bennett is a 25 y.o. male referred for evaluation of hematuria.   Saw PCP 11/17/2020  History of 3 UTIs in 2 years though on  record review UAs October 2021 and March 2022  Dipstick urinalysis 11/17/2020 with trace intact blood; urine culture no growth  ED visit 09/29/2020 with left flank pain, nausea and vomiting; CT renal stone study was unremarkable   PMH: Past Medical History:  Diagnosis Date  . Asthma   . Depression     Surgical History: No past surgical history on file.  Home Medications:  Allergies as of 12/16/2020      Reactions   Nitrates, Organic Anaphylaxis   Oxycodone Anaphylaxis   Amoxicillin    Other reaction(s): abdominal pain or cramping, nausea, vomiting ()   Nitrous Oxide       Medication List    as of December 16, 2020  8:18 AM   You have not been prescribed any medications.     Allergies:  Allergies  Allergen Reactions  . Nitrates, Organic Anaphylaxis  . Oxycodone Anaphylaxis  . Amoxicillin     Other reaction(s): abdominal pain or cramping, nausea, vomiting ()  . Nitrous Oxide     Family History: Family History  Problem Relation Age of Onset  . Alcohol abuse Father   . Heart attack Father     Social History:  reports that he has never smoked. He has never used smokeless tobacco. He reports current alcohol use. He reports current drug use. Drug: Marijuana.   Physical Exam: There were no vitals taken for this visit.  Constitutional:  Alert and oriented, No acute distress. HEENT: [place] AT, moist mucus membranes.  Trachea midline, no masses. Cardiovascular: No clubbing, cyanosis, or edema. Respiratory: Normal respiratory effort, no increased work of breathing. GI: Abdomen is soft, nontender, nondistended, no abdominal masses GU: No CVA  tenderness Lymph: No cervical or inguinal lymphadenopathy. Skin: No rashes, bruises or suspicious lesions. Neurologic: Grossly intact, no focal deficits, moving all 4 extremities. Psychiatric: Normal mood and affect.  Laboratory Data: Lab Results  Component Value Date   WBC 6.6 11/17/2020   HGB 16.1 11/17/2020   HCT 47.3 11/17/2020   MCV 91.7 11/17/2020   PLT 295.0 11/17/2020    Lab Results  Component Value Date   CREATININE 0.79 11/17/2020    No results found for: PSA  No results found for: TESTOSTERONE  No results found for: HGBA1C  Urinalysis    Component Value Date/Time   COLORURINE YELLOW 11/17/2020 1057   APPEARANCEUR CLEAR 11/17/2020 1057   LABSPEC 1.015 11/17/2020 1057   PHURINE 8.5 (A) 11/17/2020 1057   GLUCOSEU NEGATIVE 11/17/2020 1057   HGBUR TRACE-INTACT (A) 11/17/2020 1057   BILIRUBINUR NEGATIVE 11/17/2020 1057   KETONESUR NEGATIVE 11/17/2020 1057   PROTEINUR NEGATIVE 09/29/2020 1210   UROBILINOGEN 0.2 11/17/2020 1057   NITRITE NEGATIVE 11/17/2020 1057   LEUKOCYTESUR NEGATIVE 11/17/2020 1057    Lab Results  Component Value Date   BACTERIA NONE SEEN 09/29/2020    Pertinent Imaging: *** No results found for this or any previous visit.  No results found for this or any previous visit.  No results found for this or any previous visit.  No results found for this or any previous visit.  No results found for this or any  previous visit.  No results found for this or any previous visit.  No results found for this or any previous visit.  Results for orders placed during the hospital encounter of 09/29/20  CT Renal Stone Study  Narrative CLINICAL DATA:  Hematuria.  EXAM: CT ABDOMEN AND PELVIS WITHOUT CONTRAST  TECHNIQUE: Multidetector CT imaging of the abdomen and pelvis was performed following the standard protocol without IV contrast.  COMPARISON:  05/08/2020  FINDINGS: Lower chest: No acute abnormality.  Hepatobiliary:  Visualized portions of the liver are unremarkable. Gallbladder appears normal. No biliary dilatation.  Pancreas: Unremarkable. No pancreatic ductal dilatation or surrounding inflammatory changes.  Spleen: Normal in size without focal abnormality.  Adrenals/Urinary Tract: Normal appearance of the adrenal glands. The kidneys are unremarkable. No urinary tract calculi identified bilaterally. No signs of hydronephrosis. Urinary bladder is unremarkable.  Stomach/Bowel: Stomach appears normal. The appendix is visualized and appears normal. No bowel wall thickening, inflammation, or distension.  Vascular/Lymphatic: No significant vascular findings are present. No enlarged abdominal or pelvic lymph nodes.  Reproductive: Prostate is unremarkable.  Other: No free fluid or fluid collections.  Musculoskeletal: No acute or significant osseous findings.  IMPRESSION: No acute findings within the abdomen or pelvis. No urinary tract calculi identified.   Electronically Signed By: Signa Kell M.D. On: 09/29/2020 13:26   Assessment & Plan:    There are no diagnoses linked to this encounter.  No follow-ups on file.  [name], MD  [facility] [address], [address] [place], [place] [address] [phone]

## 2020-12-18 ENCOUNTER — Encounter: Payer: Self-pay | Admitting: Urology

## 2021-02-17 ENCOUNTER — Ambulatory Visit: Payer: Commercial Managed Care - PPO | Admitting: Adult Health

## 2021-03-04 ENCOUNTER — Encounter: Payer: Self-pay | Admitting: Adult Health

## 2021-04-06 ENCOUNTER — Ambulatory Visit: Payer: Commercial Managed Care - PPO | Admitting: Adult Health

## 2021-04-13 ENCOUNTER — Ambulatory Visit: Payer: Self-pay | Admitting: Physician Assistant

## 2021-04-13 ENCOUNTER — Other Ambulatory Visit: Payer: Self-pay

## 2021-04-13 DIAGNOSIS — Z113 Encounter for screening for infections with a predominantly sexual mode of transmission: Secondary | ICD-10-CM

## 2021-04-13 DIAGNOSIS — A539 Syphilis, unspecified: Secondary | ICD-10-CM

## 2021-04-13 MED ORDER — DOXYCYCLINE HYCLATE 100 MG PO TABS: 100.0000 mg | ORAL_TABLET | Freq: Two times a day (BID) | ORAL | 0 refills | Status: AC

## 2021-04-15 ENCOUNTER — Encounter: Payer: Self-pay | Admitting: Physician Assistant

## 2021-04-15 NOTE — Progress Notes (Signed)
Chart reviewed by Pharmacist  Suzanne Walker PharmD, Contract Pharmacist at [facility]  

## 2021-04-15 NOTE — Progress Notes (Signed)
[facility] Department STI clinic/screening visit  Subjective:  Trevor Bennett is a 25 y.o. male being seen today for an STI screening visit. The patient reports they do have symptoms.    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 not taking now.  11/17/2020   History of blood in urine 11/17/2020   Chest pain 11/07/2017   Costochondritis 11/07/2017   Dysuria 11/07/2017     Chief Complaint  Patient presents with   SEXUALLY TRANSMITTED DISEASE    screening    HPI  Patient reports that he went to urgent care on 04/09/21, due to having a sore on the tip of his penis.  States that he was tested for everything and all of his results are negative except for Syphilis. Reports that he was told to come here for treatment.  States that the sore started on 04/08/2021 and that it was not painful to begin with but now has swollen lymph nodes in the area that are painful and the area around the sore is swollen.  Reviewed patient's test results from urgent care that he had on his phone and HIV, Hepatitis panel, HSV,GC and Chlamydia are all negative.  RPR was reactive 1:2, but no confirmatory testing was done.  Patient denies surgeries and regular medicines.   Screening for MPX risk: Does the patient have an unexplained rash? No Is the patient MSM? Yes Does the patient endorse multiple sex partners or anonymous sex partners? No Did the patient have close or sexual contact with a person diagnosed with MPX? No Has the patient traveled outside the [place] where MPX is endemic? No Is there a high clinical suspicion for MPX-- evidenced by one of the following No  -Unlikely to be chickenpox  -Lymphadenopathy  -Rash that present in same phase of evolution on any given body part   See  flowsheet for further details and programmatic requirements.    The following portions of the patient's history were reviewed and updated as appropriate: allergies, current medications, past medical history, past social history, past surgical history and problem list.  Objective:  There were no vitals filed for this visit.  Physical Exam Constitutional:      General: He is not in acute distress.    Appearance: Normal appearance.  HENT:     Head: Normocephalic and atraumatic.     Comments: No nits,lice, or hair loss. No cervical, supraclavicular or axillary adenopathy.     Mouth/Throat:     Mouth: Mucous membranes are moist.     Pharynx: Oropharynx is clear. No oropharyngeal exudate or posterior oropharyngeal erythema.  Eyes:     Conjunctiva/sclera: Conjunctivae normal.  Pulmonary:     Effort: Pulmonary effort is normal.  Abdominal:     Palpations: Abdomen is soft. There is no mass.     Tenderness: There is no abdominal tenderness. There is no guarding or rebound.  Genitourinary:    Penis: Normal.      Testes: Normal.     Comments: Pubic area without nits, lice, hair loss, edema and erythema.  Bilateral shotty inguinal adenopathy. Penis circumcised without rash and discharge at meatus. Corona of penis with moderate erythema and edema, scabbed, healing ulcerative lesion on ventral side along corona ~83mm. Testicles descended bilaterally,nt, no masses or edema.  Musculoskeletal:     Cervical back: Neck supple. No tenderness.  Skin:    General: Skin is warm and dry.     Findings: No bruising, erythema, lesion or rash.  Neurological:     Mental Status: He is alert and oriented to person, place, and time.  Psychiatric:        Mood and Affect: Mood normal.        Behavior: Behavior normal.        Thought Content: Thought content normal.        Judgment: Judgment normal.      Assessment and Plan:  Trevor Bennett is a 25 y.o. male presenting to the [facility]  Department for STI screening  1. Screening for STD (sexually transmitted disease) Patient into clinic with symptoms. Testing results reviewed from urgent care and will only repeat RPR and add confirmatory testing today.  Rec condoms with all sex. Await test results.  Counseled that RN will call if needs to RTC for treatment once results are back.  - Syphilis Serology, [facility]  2. Syphilis Will treat for Syphilis with Doxycycline 100 mg #28 1 po BID for 14 days. No sex for 14 days and until after partners complete treatment. Rec RTC for titer monitoring in 6 months after treatment. - doxycycline (VIBRA-TABS) 100 MG tablet; Take 1 tablet (100 mg total) by mouth 2 (two) times daily for 14 days.  Dispense: 28 tablet; Refill: 0     No follow-ups on file.  Future Appointments  Date Time Provider Department Center  06/01/2021  2:00 PM Marjie Skiff, NP [facility]    Matt Holmes, [place]

## 2021-04-20 ENCOUNTER — Telehealth: Payer: Self-pay | Admitting: Physician Assistant

## 2021-04-20 NOTE — Telephone Encounter (Signed)
Call to patient regarding Syphilis test results.  Identified patient with password.  Counseled patient that his RPR was reactive 1:2 and his confirmatory test was also positive.  Counseled patient that this means that he does have Syphilis and stressed importance of completing all medicine as directed.  Counseled that he should not have sex until after he and his partner/s complete treatment. Counseled that he should RTC for titer check in 6 mo, 12 mo and annually to monitor for reinfection.  Patient verbalizes understanding and all questions were answered.

## 2021-06-01 ENCOUNTER — Encounter: Payer: Self-pay | Admitting: Nurse Practitioner

## 2021-06-01 ENCOUNTER — Other Ambulatory Visit: Payer: Self-pay

## 2021-06-01 ENCOUNTER — Ambulatory Visit: Payer: Commercial Managed Care - PPO | Admitting: Nurse Practitioner

## 2021-06-01 VITALS — BP 93/56 | HR 94 | Temp 98.7°F | Ht 67.0 in | Wt 160.6 lb

## 2021-06-01 DIAGNOSIS — A539 Syphilis, unspecified: Secondary | ICD-10-CM

## 2021-06-01 DIAGNOSIS — Z87898 Personal history of other specified conditions: Secondary | ICD-10-CM

## 2021-06-01 DIAGNOSIS — J45990 Exercise induced bronchospasm: Secondary | ICD-10-CM | POA: Diagnosis not present

## 2021-06-01 DIAGNOSIS — F321 Major depressive disorder, single episode, moderate: Secondary | ICD-10-CM

## 2021-06-01 DIAGNOSIS — N5089 Other specified disorders of the male genital organs: Secondary | ICD-10-CM | POA: Insufficient documentation

## 2021-06-01 DIAGNOSIS — Z7689 Persons encountering health services in other specified circumstances: Secondary | ICD-10-CM

## 2021-06-01 NOTE — Assessment & Plan Note (Signed)
Chronic, ongoing with PHQ9 = 19 and GAD7 = 18.  He denies any SI/HI today.  Reports none in many weeks.  Wishes not to start medication, discussed various treatment options available to include therapy, SSRI, SNRI, Buspar.  At this time he refuses any treatment and wishes to continue home self care.

## 2021-06-01 NOTE — Assessment & Plan Note (Signed)
Avoid benzo prescriptions for mood treatment.

## 2021-06-01 NOTE — Patient Instructions (Signed)
Testicular Self-Exam A self-examination of your testicles (testicular self-exam) involves looking at and feeling your testicles for abnormal lumps or swelling. Several things can cause swelling, lumps, or pain in your testicles. Some of these causes are: Injuries. Inflammation. Infection. Buildup of fluids around the testicle (hydrocele). Twisted testicles (testicular torsion). Testicular cancer. You may be at risk for testicular cancer if you have: An undescended testicle (cryptorchidism). A history of previous testicular cancer. A family history of testicular cancer. General tips and recommendations The testicles are easiest to examine after a warm bath or shower. They are more difficult to examine when you are cold because the muscles attached to the testicles retract and pull them up higher or into the abdomen. A normal testicle is egg-shaped and feels firm. It is smooth and not tender. It is normal to feel a firm, spaghetti-like cord at the back of your testicle. This is the spermatic cord. How to do a testicular self-exam  Stand and hold your penis away from your body. Look at each testicle to check for changes in appearance, such as swelling or changes in size or shape. Roll each testicle between your thumb and forefinger, feeling the entire testicle. Feel for: Lumps. Swelling. Discomfort. Check the groin area between your abdomen and upper thighs on both sides of your body. Look and feel for any swelling or bumps that are tender. These could be enlarged lymph nodes. Contact a health care provider if: You find any bumps or lumps, such as a small, hard, pea-sized lump. You find swelling, pain, or soreness. You see or feel any other changes in your testicles. Summary A self-examination of your testicles (testicular self-exam) involves looking at and feeling your testicles for any changes. Check each of your testicles for lumps, swelling, or discomfort. These changes can be caused  by many things. Check for swelling or tender bumps in your groin area between your lower abdomen and upper thighs. This information is not intended to replace advice given to you by your health care provider. Make sure you discuss any questions you have with your health care provider. Document Revised: 06/10/2019 Document Reviewed: 06/10/2019 Elsevier Patient Education  2022 Elsevier Inc.  

## 2021-06-01 NOTE — Assessment & Plan Note (Signed)
Ongoing, stable with no use of Albuterol at home.  Continue this regimen and adjust as needed.

## 2021-06-01 NOTE — Assessment & Plan Note (Signed)
Noted on exam and patient reports some increase in size.  Will obtain imaging of area, then determine next steps for treatment.  No pain to palpation.  Recommend he continue to monitor area closely.

## 2021-06-01 NOTE — Progress Notes (Signed)
New Patient Office Visit  Subjective:  Patient ID: Trevor Bennett, male    DOB: 1995/08/17  Age: 25 y.o. MRN: [id number]  CC:  Chief Complaint  Patient presents with   Cyst    Patient states he feels 2 hard knots that are connected to his right testicle. Patient denies having any pain associated to the area and states he think they have increased in size, but also thinks it could just be a mental thing. Patient first noticed it about 3 months ago.     HPI [name] presents for new patient visit to establish care.  Introduced to Publishing rights manager role and practice setting.  All questions answered.  Discussed provider/patient relationship and expectations.  Has not been seen by primary care provider recently, moved here 6 months ago.  ASTHMA Has history of childhood asthma, which he reports he has grown out of.  Notices symptoms now only on occasion with working out, heavy cardio and 45 minutes into it.   Asthma status: controlled Satisfied with current treatment?: yes Albuterol/rescue inhaler frequency: none Dyspnea frequency: only with heavy cardio Wheezing frequency: none Cough frequency: none Nocturnal symptom frequency: none Limitation of activity: no Current upper respiratory symptoms: no Triggers: heavy cardio Home peak flows: none Last Spirometry: unknown Failed/intolerant to following asthma meds: none Asthma meds in past: Flonase Aerochamber/spacer use: no Visits to ER or Urgent Care in past year: no Pneumovax:  refused Influenza:  refused    TESTICLE MASS Presents for concern of 2 "knots" to right testicle, have been present about 3 months.  He feels they may have increased in size, but is not sure.    On review does have history of syphilis treated with Doxycycline on 04/13/21 and to have repeat RPR on 10/11/21 -- to have recheck at 6 months, 12 months, and then annually per note from health department. At the time he had sore to penis, he reports this area is improving  and is completely closed.  No history of other STDs.  Is currently sexually active, using condoms.  Currently only one partner - male, identifies as bisexual.   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  He prefers to take no medications.  No history of trauma and no family history of mental health issues.  Father had alcohol abuse.   Mood status: stable Satisfied with current treatment?: yes Symptom severity: mild  Duration of current treatment : chronic Side effects: no Medication compliance: good compliance Psychotherapy/counseling: yes in the past went for 9 months Previous psychiatric medications: benzo Depressed mood: yes Anxious mood: yes Anhedonia: no Significant weight loss or gain: no Insomnia: yes hard to stay asleep -- uses MJ to help with sleep Fatigue: yes Feelings of worthlessness or guilt: no Impaired concentration/indecisiveness: no Suicidal ideations: no Hopelessness: no Crying spells: no Depression screen [facility] 2/9 06/01/2021 11/17/2020  Decreased Interest 2 2  Down, Depressed, Hopeless 2 3  PHQ - 2 Score 4 5  Altered sleeping 3 3  Tired, decreased energy 2 2  Change in appetite 2 1  Feeling bad or failure about yourself  2 2  Trouble concentrating 2 2  Moving slowly or fidgety/restless 3 3  Suicidal thoughts 1 0  PHQ-9 Score 19 18  Difficult doing work/chores Somewhat difficult Somewhat difficult  GAD 7 : Generalized Anxiety Score 06/01/2021  Nervous, Anxious, on Edge 3  Control/stop worrying 2  Worry too much - different things 3  Trouble relaxing 2  Restless 3  Easily annoyed or irritable 3  Afraid - awful might happen 2  Total GAD 7 Score 18  Anxiety Difficulty Somewhat difficult    Past Medical History:  Diagnosis Date    Asthma    Depression     History reviewed. No pertinent surgical history.  Family History  Problem Relation Age of Onset   Alcohol abuse Father    Heart attack Father     Social History   Socioeconomic History   Marital status: Single    Spouse name: Not on file   Number of children: Not on file   Years of education: Not on file   Highest education level: Not on file  Occupational History   Not on file  Tobacco Use   Smoking status: Never   Smokeless tobacco: Never  Substance and Sexual Activity   Alcohol use: Yes   Drug use: Yes    Types: Marijuana   Sexual activity: Yes    Comment: with male and male  Other Topics Concern   Not on file  Social History Narrative   Not on file   Social Determinants of Health   Financial Resource Strain: Low Risk    Difficulty of Paying Living Expenses: Not hard at all  Food Insecurity: No Food Insecurity   Worried About Programme researcher, broadcasting/film/video in the Last Year: Never true   Ran Out of Food in the Last Year: Never true  Transportation Needs: No Transportation Needs   Lack of Transportation (Medical): No   Lack of Transportation (Non-Medical): No  Physical Activity: Sufficiently Active   Days of Exercise per Week: 5 days   Minutes of Exercise per Session: 50 min  Stress: No Stress Concern Present   Feeling of Stress : Only a little  Social Connections: Socially Isolated   Frequency of Communication with Friends and Family: More than three times a week   Frequency of Social Gatherings with Friends and Family: More than three times a week   Attends Religious Services: Never   Database administrator or Organizations: No   Attends Engineer, structural: Never   Marital Status: Never married  Catering manager Violence: Not At Risk   Fear of Current or Ex-Partner: No   Emotionally Abused: No   Physically Abused: No   Sexually Abused: No    ROS Review of Systems  Constitutional:  Negative for activity change,  diaphoresis, fatigue and fever.  Respiratory:  Negative for cough, chest tightness, shortness of breath and wheezing.   Cardiovascular:  Negative for chest pain, palpitations and leg swelling.  Gastrointestinal: Negative.   Genitourinary: Negative.   Neurological: Negative.   Psychiatric/Behavioral: Negative.     Objective:   Today's Vitals: BP (!) 93/56   Pulse 94   Temp 98.7 F (37.1 C) (Oral)   Ht 5\' 7"  (1.702 m)   Wt 160 lb 9.6 oz (72.8 kg)   SpO2 95%   BMI 25.15 kg/m   Physical Exam Vitals and nursing note reviewed. Exam conducted with a chaperone present.  Constitutional:      General: He is awake. He is not in acute distress.    Appearance: He is well-developed and well-groomed. He is not ill-appearing or toxic-appearing.  HENT:     Head: Normocephalic and atraumatic.  Right Ear: Hearing normal. No drainage.     Left Ear: Hearing normal. No drainage.  Eyes:     General: Lids are normal.        Right eye: No discharge.        Left eye: No discharge.     Conjunctiva/sclera: Conjunctivae normal.     Pupils: Pupils are equal, round, and reactive to light.  Neck:     Thyroid: No thyromegaly.     Vascular: No carotid bruit.  Cardiovascular:     Rate and Rhythm: Normal rate and regular rhythm.     Heart sounds: Normal heart sounds, S1 normal and S2 normal. No murmur heard.   No gallop.  Pulmonary:     Effort: Pulmonary effort is normal. No accessory muscle usage or respiratory distress.     Breath sounds: Normal breath sounds.  Abdominal:     General: Bowel sounds are normal. There is no distension.     Palpations: Abdomen is soft.     Tenderness: There is no abdominal tenderness.     Hernia: There is no hernia in the left inguinal area or right inguinal area.  Genitourinary:    Penis: Normal.      Testes:        Right: Mass present. Tenderness or swelling not present.        Left: Mass, tenderness or swelling not present.     Epididymis:     Right: Normal.      Left: Normal.    Musculoskeletal:        General: Normal range of motion.     Cervical back: Normal range of motion and neck supple.     Right lower leg: No edema.     Left lower leg: No edema.  Lymphadenopathy:     Cervical: No cervical adenopathy.  Skin:    General: Skin is warm and dry.     Capillary Refill: Capillary refill takes less than 2 seconds.     Findings: No rash.  Neurological:     Mental Status: He is alert and oriented to person, place, and time.     Deep Tendon Reflexes: Reflexes are normal and symmetric.  Psychiatric:        Mood and Affect: Mood normal.        Speech: Speech normal.        Behavior: Behavior normal. Behavior is cooperative.        Thought Content: Thought content normal.    Assessment & Plan:   Problem List Items Addressed This Visit       Respiratory   Mild exercise-induced asthma    Ongoing, stable with no use of Albuterol at home.  Continue this regimen and adjust as needed.        Other   Depression, major, single episode, moderate (HCC) - Primary    Chronic, ongoing with PHQ9 = 19 and GAD7 = 18.  He denies any SI/HI today.  Reports none in many weeks.  Wishes not to start medication, discussed various treatment options available to include therapy, SSRI, SNRI, Buspar.  At this time he refuses any treatment and wishes to continue home self care.        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 not taking now.     Avoid benzo prescriptions for mood treatment.      Mass of right testicle    Noted on exam and patient reports some increase in size.  Will obtain imaging  of area, then determine next steps for treatment.  No pain to palpation.  Recommend he continue to monitor area closely.      Relevant Orders   US Scrotum   Syphilis    Treated on 04/13/21 at [facility] with Doxycycline.  To have retesting at 6 months, 12 months, and then annually per their note.  Next RPR due 10/11/21, will have return for physical at that  time to ensure retesting done.  Recommend protection during intercourse.  He reports HIV testing was negative.      Other Visit Diagnoses     Encounter to establish care           No outpatient encounter medications on file as of 06/01/2021.   No facility-administered encounter medications on file as of 06/01/2021.    Follow-up: Return in about 4 months (around 10/08/2021) for Annual physical.   Marjie Skiff, NP

## 2021-06-01 NOTE — Assessment & Plan Note (Addendum)
Treated on 04/13/21 at [facility] with Doxycycline.  To have retesting at 6 months, 12 months, and then annually per their note.  Next RPR due 10/11/21, will have return for physical at that time to ensure retesting done.  Recommend protection during intercourse.  He reports HIV testing was negative.

## 2021-06-09 ENCOUNTER — Ambulatory Visit
Admission: RE | Admit: 2021-06-09 | Discharge: 2021-06-09 | Disposition: A | Discharge status: Home / Self Care | Payer: Commercial Managed Care - PPO | Source: Ambulatory Visit | Attending: Nurse Practitioner | Admitting: Nurse Practitioner

## 2021-06-09 ENCOUNTER — Other Ambulatory Visit: Payer: Self-pay

## 2021-06-09 DIAGNOSIS — N5089 Other specified disorders of the male genital organs: Secondary | ICD-10-CM | POA: Diagnosis present

## 2021-06-10 NOTE — Progress Notes (Signed)
Contacted via MyChart   Happy Thanksgiving Trevor Bennett, your imaging has returned.  The area palpated appears to be an epididymal cyst, these are benign (non cancerous) findings that often need no intervention.  Your cyst is less then 2 cm, so we can monitor -- if any changes in size or increased pain with it, then I would recommend we get you into urology.  Keep an eye on area and let me know if any changes.  Any questions? Keep being amazing!!  Thank you for allowing me to participate in your care.  I appreciate you. Kindest regards, Raine Blodgett

## 2021-09-02 ENCOUNTER — Other Ambulatory Visit: Payer: Self-pay

## 2021-09-02 ENCOUNTER — Ambulatory Visit: Payer: Self-pay | Admitting: Family Medicine

## 2021-09-02 DIAGNOSIS — Z708 Other sex counseling: Secondary | ICD-10-CM

## 2021-09-02 NOTE — Progress Notes (Signed)
Pt in clinic for re-screening after treatment for syphilis.   PT currently has poison ivy outbreak.  Pt ask to reschedule appointment for blood draw once poison ivy is healed.  Pt escorted to front to reschedule appointment for 1 week.   Wendi Snipes, FNP

## 2021-09-10 ENCOUNTER — Other Ambulatory Visit: Payer: Self-pay

## 2021-09-10 ENCOUNTER — Encounter: Payer: Self-pay | Admitting: Family Medicine

## 2021-09-10 ENCOUNTER — Ambulatory Visit: Payer: Commercial Managed Care - PPO | Admitting: Family Medicine

## 2021-09-10 DIAGNOSIS — Z113 Encounter for screening for infections with a predominantly sexual mode of transmission: Secondary | ICD-10-CM

## 2021-09-10 LAB — HEPATITIS B SURFACE ANTIGEN: Hepatitis B Surface Ag: NONREACTIVE

## 2021-09-10 LAB — HM HIV SCREENING LAB: HM HIV Screening: NEGATIVE

## 2021-09-10 LAB — HM HEPATITIS C SCREENING LAB: HM Hepatitis Screen: NEGATIVE

## 2021-09-10 NOTE — Progress Notes (Signed)
[facility] Department STI clinic/screening visit  Subjective:  Trevor Bennett is a 26 y.o. male being seen today for an STI screening visit. The patient reports they do not have symptoms.    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 not taking now.  11/17/2020   History of blood in urine 11/17/2020   Syphilis 11/07/2017     Chief Complaint  Patient presents with   [place]    HPI  Patient reports here for screening, denies s/sx.  Pt has history of + syphilis.  1:2 on 03/2021.   Does the patient or their partner desires a pregnancy in the next year? No  Screening for MPX risk: Does the patient have an unexplained rash? No Is the patient MSM? Yes Does the patient endorse multiple sex partners or anonymous sex partners? No Did the patient have close or sexual contact with a person diagnosed with MPX? No Has the patient traveled outside the [place] where MPX is endemic? No Is there a high clinical suspicion for MPX-- evidenced by one of the following No  -Unlikely to be chickenpox  -Lymphadenopathy  -Rash that present in same phase of evolution on any given body part   See flowsheet for further details and programmatic requirements.    The following portions of the patient's history were reviewed and updated as appropriate: allergies, current medications, past medical history, past social history, past surgical history and problem list.  Objective:  There were no vitals filed for this visit.  Physical Exam Constitutional:      Appearance: Normal appearance.  HENT:     Head: Normocephalic.     Mouth/Throat:     Mouth: Mucous  membranes are moist.     Pharynx: Oropharynx is clear. No oropharyngeal exudate.  Pulmonary:     Effort: Pulmonary effort is normal.  Genitourinary:    Comments: Deferred - urine sample collected  Musculoskeletal:     Cervical back: Normal range of motion.  Lymphadenopathy:     Cervical: No cervical adenopathy.  Skin:    General: Skin is warm and dry.     Findings: No bruising, erythema, lesion or rash.  Neurological:     Mental Status: He is alert and oriented to person, place, and time.  Psychiatric:        Mood and Affect: Mood normal.        Behavior: Behavior normal.      Assessment and Plan:  Trevor Bennett is a 26 y.o. male presenting to the [facility] Department for STI screening  1. Screening examination for venereal disease Patient does not have STI symptoms Patient accepted all screenings including oral, urine CT/GC and bloodwork for HIV/RPR.  Patient meets criteria for HepB screening? Yes. Ordered? Yes Patient meets criteria for HepC screening? Yes. Ordered? Yes Recommended condom use with all sex Discussed importance of condom use for STI prevent  Discussed time line for [facility] results and that patient will be called with positive results and encouraged patient to call if he had not heard in 2 weeks  Recommended returning for continued or worsening symptoms.     - HBV Antigen/Antibody [facility] - HIV/HCV [place]  [facility] - Syphilis Serology, [place] [facility] - Chlamydia/Gonorrhea [place] [facility]   No follow-ups on file.  Future Appointments  Date Time Provider [place]  09/29/2021  8:20 AM Venita Lick, NP [facility] [place], FNP

## 2021-09-10 NOTE — Progress Notes (Signed)
Pt here for STD screening.  Condoms declined.  Rosielee Corporan M Novalee Horsfall, RN ° °

## 2021-09-26 NOTE — Patient Instructions (Signed)
Managing Anxiety, Adult ?After being diagnosed with anxiety, you may be relieved to know why you have felt or behaved a certain way. You may also feel overwhelmed about the treatment ahead and what it will mean for your life. With care and support, you can manage this condition. ?How to manage lifestyle changes ?Managing stress and anxiety ?Stress is your body's reaction to life changes and events, both good and bad. Most stress will last just a few hours, but stress can be ongoing and can lead to more than just stress. Although stress can play a major role in anxiety, it is not the same as anxiety. Stress is usually caused by something external, such as a deadline, test, or competition. Stress normally passes after the triggering event has ended.  ?Anxiety is caused by something internal, such as imagining a terrible outcome or worrying that something will go wrong that will devastate you. Anxiety often does not go away even after the triggering event is over, and it can become long-term (chronic) worry. It is important to understand the differences between stress and anxiety and to manage your stress effectively so that it does not lead to an anxious response. ?Talk with your health care provider or a counselor to learn more about reducing anxiety and stress. He or she may suggest tension reduction techniques, such as: ?Music therapy. Spend time creating or listening to music that you enjoy and that inspires you. ?Mindfulness-based meditation. Practice being aware of your normal breaths while not trying to control your breathing. It can be done while sitting or walking. ?Centering prayer. This involves focusing on a word, phrase, or sacred image that means something to you and brings you peace. ?Deep breathing. To do this, expand your stomach and inhale slowly through your nose. Hold your breath for 3-5 seconds. Then exhale slowly, letting your stomach muscles relax. ?Self-talk. Learn to notice and identify  thought patterns that lead to anxiety reactions and change those patterns to thoughts that feel peaceful. ?Muscle relaxation. Taking time to tense muscles and then relax them. ?Choose a tension reduction technique that fits your lifestyle and personality. These techniques take time and practice. Set aside 5-15 minutes a day to do them. Therapists can offer counseling and training in these techniques. The training to help with anxiety may be covered by some insurance plans. ?Other things you can do to manage stress and anxiety include: ?Keeping a stress diary. This can help you learn what triggers your reaction and then learn ways to manage your response. ?Thinking about how you react to certain situations. You may not be able to control everything, but you can control your response. ?Making time for activities that help you relax and not feeling guilty about spending your time in this way. ?Doing visual imagery. This involves imagining or creating mental pictures to help you relax. ?Practicing yoga. Through yoga poses, you can lower tension and promote relaxation. ? ?Medicines ?Medicines can help ease symptoms. Medicines for anxiety include: ?Antidepressant medicines. These are usually prescribed for long-term daily control. ?Anti-anxiety medicines. These may be added in severe cases, especially when panic attacks occur. ?Medicines will be prescribed by a health care provider. When used together, medicines, psychotherapy, and tension reduction techniques may be the most effective treatment. ?Relationships ?Relationships can play a big part in helping you recover. Try to spend more time connecting with trusted friends and family members. ?Consider going to couples counseling if you have a partner, taking family education classes, or going to family   therapy. ?Therapy can help you and others better understand your condition. ?How to recognize changes in your anxiety ?Everyone responds differently to treatment for  anxiety. Recovery from anxiety happens when symptoms decrease and stop interfering with your daily activities at home or work. This may mean that you will start to: ?Have better concentration and focus. Worry will interfere less in your daily thinking. ?Sleep better. ?Be less irritable. ?Have more energy. ?Have improved memory. ?It is also important to recognize when your condition is getting worse. Contact your health care provider if your symptoms interfere with home or work and you feel like your condition is not improving. ?Follow these instructions at home: ?Activity ?Exercise. Adults should do the following: ?Exercise for at least 150 minutes each week. The exercise should increase your heart rate and make you sweat (moderate-intensity exercise). ?Strengthening exercises at least twice a week. ?Get the right amount and quality of sleep. Most adults need 7-9 hours of sleep each night. ?Lifestyle ? ?Eat a healthy diet that includes plenty of vegetables, fruits, whole grains, low-fat dairy products, and lean protein. ?Do not eat a lot of foods that are high in fats, added sugars, or salt (sodium). ?Make choices that simplify your life. ?Do not use any products that contain nicotine or tobacco. These products include cigarettes, chewing tobacco, and vaping devices, such as e-cigarettes. If you need help quitting, ask your health care provider. ?Avoid caffeine, alcohol, and certain over-the-counter cold medicines. These may make you feel worse. Ask your pharmacist which medicines to avoid. ?General instructions ?Take over-the-counter and prescription medicines only as told by your health care provider. ?Keep all follow-up visits. This is important. ?Where to find support ?You can get help and support from these sources: ?Self-help groups. ?Online and community organizations. ?A trusted spiritual leader. ?Couples counseling. ?Family education classes. ?Family therapy. ?Where to find more information ?You may find  that joining a support group helps you deal with your anxiety. The following sources can help you locate counselors or support groups near you: ?Mental Health America: www.mentalhealthamerica.net ?Anxiety and Depression Association of America (ADAA): www.adaa.org ?National Alliance on Mental Illness (NAMI): www.nami.org ?Contact a health care provider if: ?You have a hard time staying focused or finishing daily tasks. ?You spend many hours a day feeling worried about everyday life. ?You become exhausted by worry. ?You start to have headaches or frequently feel tense. ?You develop chronic nausea or diarrhea. ?Get help right away if: ?You have a racing heart and shortness of breath. ?You have thoughts of hurting yourself or others. ?If you ever feel like you may hurt yourself or others, or have thoughts about taking your own life, get help right away. Go to your nearest emergency department or: ?Call your local emergency services (911 in the [place] ?Call a suicide crisis helpline, such as the National Suicide Prevention Lifeline at 1-800-273-8255 or 988 in the [place] This is open 24 hours a day in the [place] ?Text the Crisis Text Line at 741741 (in the [place] ?Summary ?Taking steps to learn and use tension reduction techniques can help calm you and help prevent triggering an anxiety reaction. ?When used together, medicines, psychotherapy, and tension reduction techniques may be the most effective treatment. ?Family, friends, and partners can play a big part in supporting you. ?This information is not intended to replace advice given to you by your health care provider. Make sure you discuss any questions you have with your health care provider. ?Document Revised: 01/27/2021 Document Reviewed: 10/25/2020 ?Elsevier Patient   Education ? 2022 Elsevier Inc. ? ?

## 2021-09-29 ENCOUNTER — Ambulatory Visit (INDEPENDENT_AMBULATORY_CARE_PROVIDER_SITE_OTHER): Payer: Commercial Managed Care - PPO | Admitting: Nurse Practitioner

## 2021-09-29 ENCOUNTER — Encounter: Payer: Self-pay | Admitting: Nurse Practitioner

## 2021-09-29 ENCOUNTER — Other Ambulatory Visit: Payer: Self-pay

## 2021-09-29 VITALS — BP 105/69 | HR 81 | Temp 97.9°F | Ht 67.75 in | Wt 159.2 lb

## 2021-09-29 DIAGNOSIS — Z0000 Encounter for general adult medical examination without abnormal findings: Secondary | ICD-10-CM | POA: Diagnosis not present

## 2021-09-29 DIAGNOSIS — Z13220 Encounter for screening for lipoid disorders: Secondary | ICD-10-CM

## 2021-09-29 DIAGNOSIS — F1290 Cannabis use, unspecified, uncomplicated: Secondary | ICD-10-CM | POA: Diagnosis not present

## 2021-09-29 DIAGNOSIS — Z136 Encounter for screening for cardiovascular disorders: Secondary | ICD-10-CM

## 2021-09-29 DIAGNOSIS — J45990 Exercise induced bronchospasm: Secondary | ICD-10-CM | POA: Diagnosis not present

## 2021-09-29 DIAGNOSIS — F321 Major depressive disorder, single episode, moderate: Secondary | ICD-10-CM

## 2021-09-29 DIAGNOSIS — A539 Syphilis, unspecified: Secondary | ICD-10-CM

## 2021-09-29 DIAGNOSIS — Z87898 Personal history of other specified conditions: Secondary | ICD-10-CM | POA: Diagnosis not present

## 2021-09-29 MED ORDER — SERTRALINE HCL 25 MG PO TABS: 25.0000 mg | ORAL_TABLET | Freq: Every day | ORAL | 3 refills | Status: AC

## 2021-09-29 NOTE — Assessment & Plan Note (Signed)
Treated on 04/13/21 at [facility] with Doxycycline.  To have retesting at 6 months, 12 months, and then annually per their note.  Next RPR due 10/11/21, check RPR today.  Recommend protection during intercourse.  HIV testing was negative. ?

## 2021-09-29 NOTE — Assessment & Plan Note (Signed)
Ongoing, stable with no use of Albuterol at home.  Continue this regimen and adjust as needed. 

## 2021-09-29 NOTE — Assessment & Plan Note (Signed)
Avoid benzo prescriptions for mood treatment. 
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
## 2021-09-29 NOTE — Assessment & Plan Note (Signed)
Chronic, ongoing with PHQ9 = 12 and GAD7 = 13.  He denies any SI/HI today.  Discussed all treatment options with him at length.  He is interested in trying SSRI.  Will start with Zoloft 25 MG daily and adjust as needed.  Educated on medication and Black Box warning.  To alert provider if any SI presents.  Return in 2 weeks. ?

## 2021-09-29 NOTE — Assessment & Plan Note (Signed)
Recommend cutting back on this as may be affecting mood due to frequent use. ?

## 2021-09-30 LAB — CBC WITH DIFFERENTIAL/PLATELET
Basophils Absolute: 0 10*3/uL (ref 0.0–0.2)
Basos: 1 %
EOS (ABSOLUTE): 0.1 10*3/uL (ref 0.0–0.4)
Eos: 2 %
Hematocrit: 47.3 % (ref 37.5–51.0)
Hemoglobin: 16.8 g/dL (ref 13.0–17.7)
Immature Grans (Abs): 0 10*3/uL (ref 0.0–0.1)
Immature Granulocytes: 0 %
Lymphocytes Absolute: 2 10*3/uL (ref 0.7–3.1)
Lymphs: 37 %
MCH: 32.2 pg (ref 26.6–33.0)
MCHC: 35.5 g/dL (ref 31.5–35.7)
MCV: 91 fL (ref 79–97)
Monocytes Absolute: 0.4 10*3/uL (ref 0.1–0.9)
Monocytes: 7 %
Neutrophils Absolute: 2.9 10*3/uL (ref 1.4–7.0)
Neutrophils: 53 %
Platelets: 258 10*3/uL (ref 150–450)
RBC: 5.21 x10E6/uL (ref 4.14–5.80)
RDW: 12.8 % (ref 11.6–15.4)
WBC: 5.4 10*3/uL (ref 3.4–10.8)

## 2021-09-30 LAB — COMPREHENSIVE METABOLIC PANEL
ALT: 22 IU/L (ref 0–44)
AST: 20 IU/L (ref 0–40)
Albumin/Globulin Ratio: 2.1 (ref 1.2–2.2)
Albumin: 4.6 g/dL (ref 4.1–5.2)
Alkaline Phosphatase: 70 IU/L (ref 44–121)
BUN/Creatinine Ratio: 15 (ref 9–20)
BUN: 13 mg/dL (ref 6–20)
Bilirubin Total: 1.2 mg/dL (ref 0.0–1.2)
CO2: 26 mmol/L (ref 20–29)
Calcium: 9.5 mg/dL (ref 8.7–10.2)
Chloride: 104 mmol/L (ref 96–106)
Creatinine, Ser: 0.87 mg/dL (ref 0.76–1.27)
Globulin, Total: 2.2 g/dL (ref 1.5–4.5)
Glucose: 90 mg/dL (ref 70–99)
Potassium: 4.1 mmol/L (ref 3.5–5.2)
Sodium: 144 mmol/L (ref 134–144)
Total Protein: 6.8 g/dL (ref 6.0–8.5)
eGFR: 123 mL/min/{1.73_m2} (ref >59)

## 2021-09-30 LAB — TSH: TSH: 0.848 u[IU]/mL (ref 0.450–4.500)

## 2021-09-30 LAB — LIPID PANEL W/O CHOL/HDL RATIO
Cholesterol, Total: 147 mg/dL (ref 100–199)
HDL: 39 mg/dL — ABNORMAL LOW (ref >39)
LDL Chol Calc (NIH): 94 mg/dL (ref 0–99)
Triglycerides: 72 mg/dL (ref 0–149)
VLDL Cholesterol Cal: 14 mg/dL (ref 5–40)

## 2021-09-30 LAB — RPR: RPR Ser Ql: NONREACTIVE

## 2021-09-30 NOTE — Progress Notes (Signed)
Contacted via MyChart ? ? ?Good morning Bralynn, your labs have returned and overall look great.  No changes needed.  RPR, syphilis testing, is non reactive -- good news!!  Any questions? ?Keep being stellar!!  Thank you for allowing me to participate in your care.  I appreciate you. ?Kindest regards, ?Morell Mears ?

## 2021-10-10 NOTE — Patient Instructions (Incomplete)

## 2021-10-14 ENCOUNTER — Ambulatory Visit: Payer: Commercial Managed Care - PPO | Admitting: Nurse Practitioner

## 2021-12-16 ENCOUNTER — Ambulatory Visit: Payer: Commercial Managed Care - PPO

## 2022-09-17 IMAGING — US US SCROTUM W/ DOPPLER COMPLETE
1 series · 14 of 25 positions shown · non-contrast
Comparison: None.

CLINICAL DATA: cystic like mass to right upper testicular area

EXAM:
SCROTAL ULTRASOUND
DOPPLER ULTRASOUND OF THE TESTICLES
TECHNIQUE: Complete ultrasound examination of the testicles, epididymis, and
other scrotal structures was performed. Color and spectral Doppler
ultrasound were also utilized to evaluate blood flow to the
testicles.

[Series 1: us scrotum w/ doppler complete · 0.07mm/px · 14 of 71 slices shown]
[im 1/71]
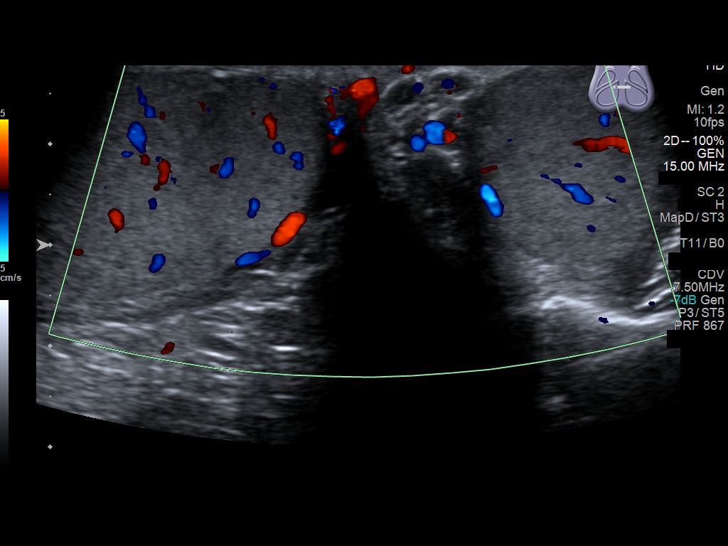
[im 6/71]
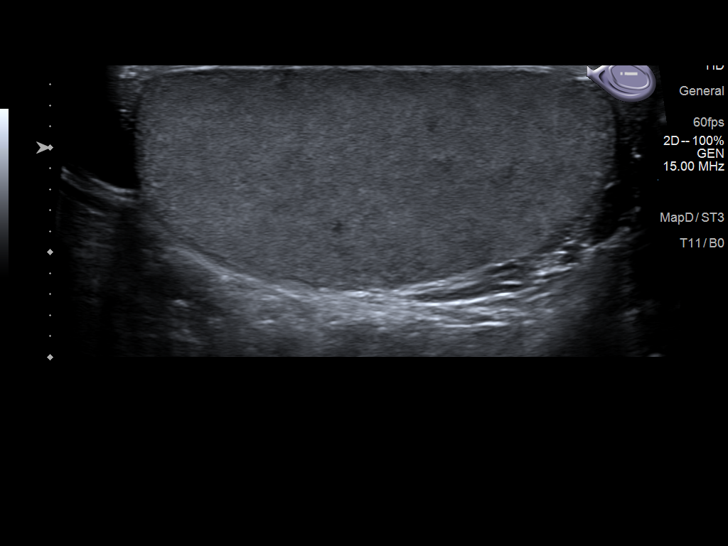
[im 12/71]
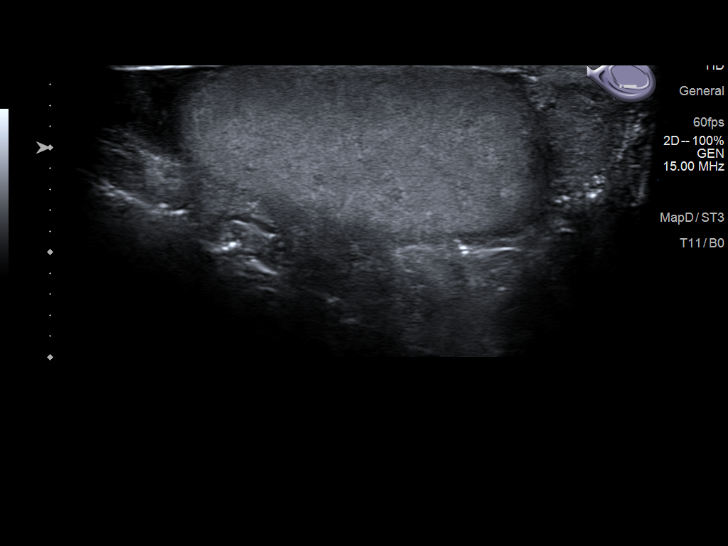
[im 18/71]
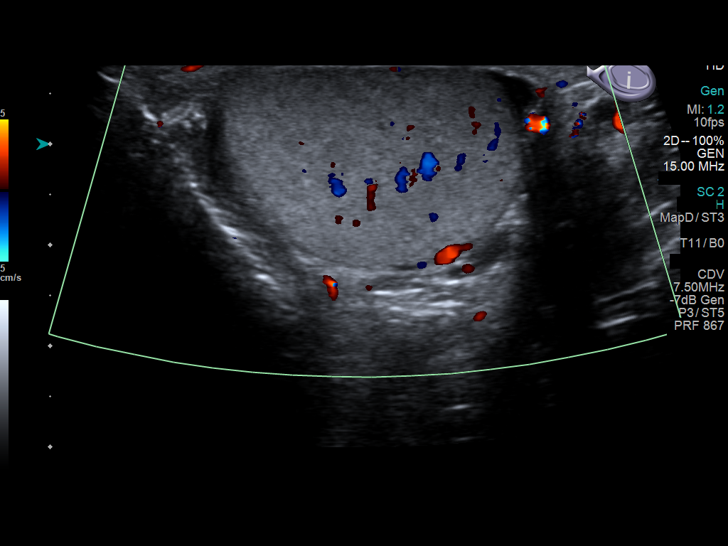
[im 24/71]
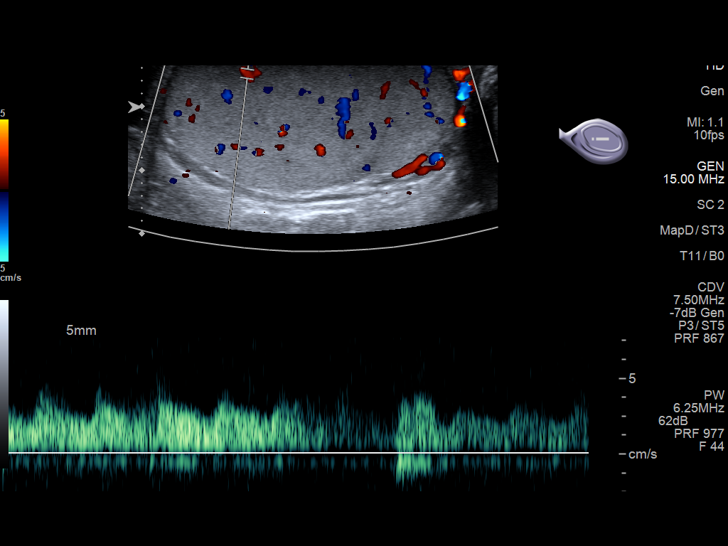
[im 27/71]
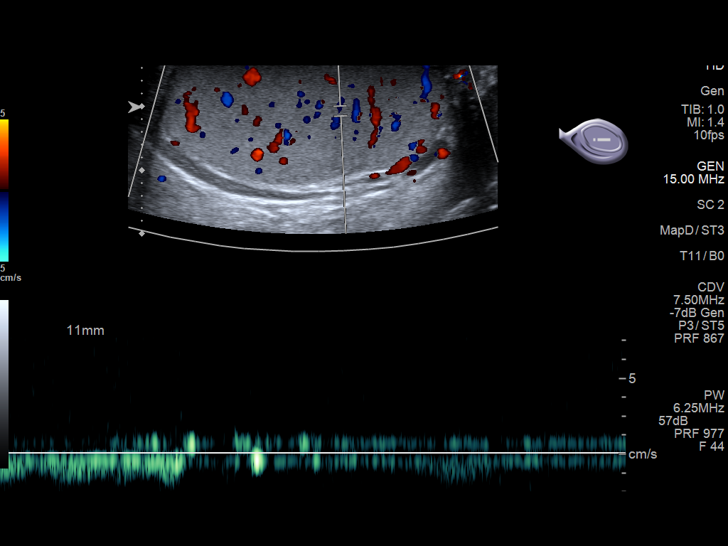
[im 33/71]
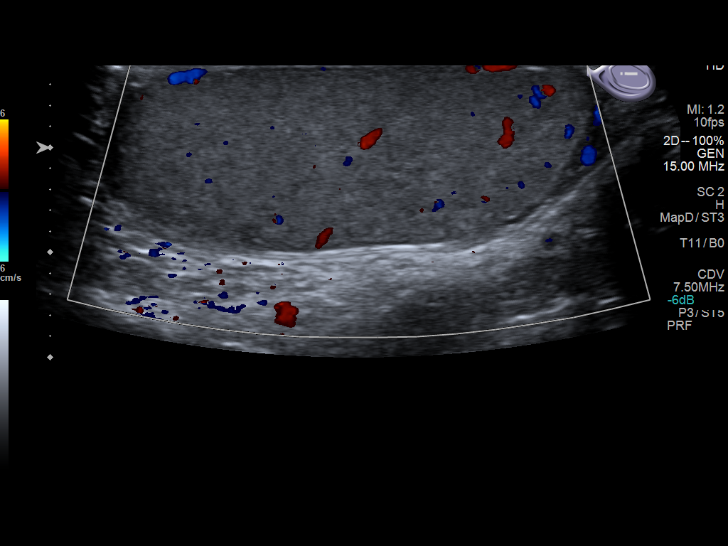
[im 38/71]
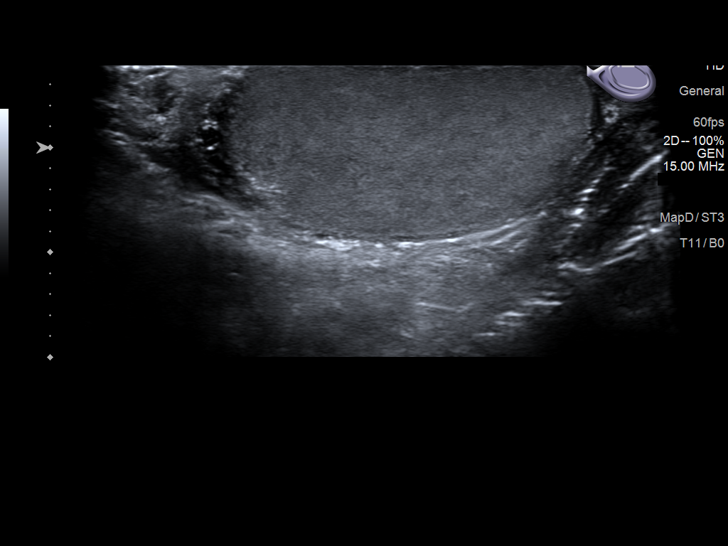
[im 44/71]
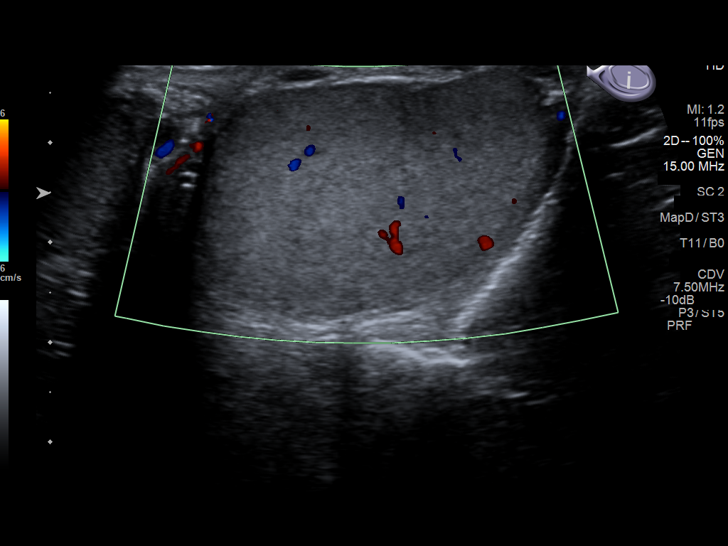
[im 47/71]
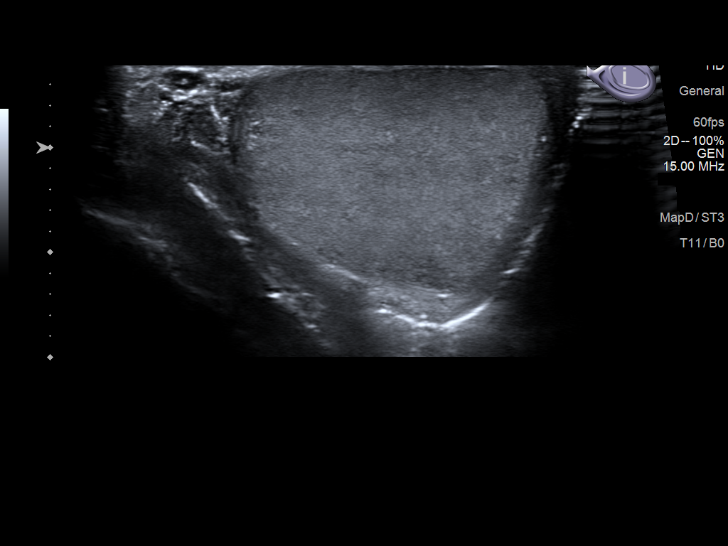
[im 53/71]
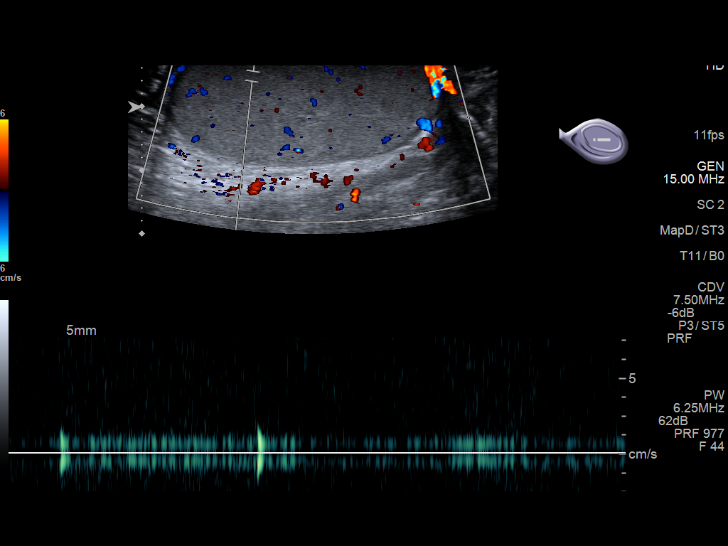
[im 59/71]
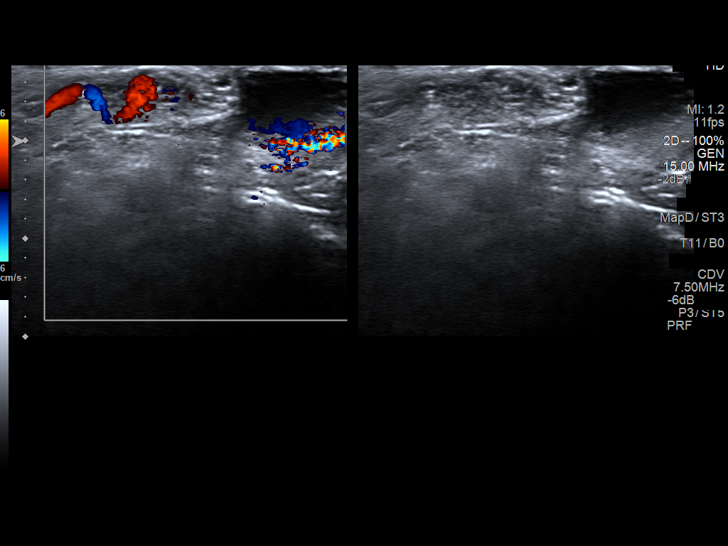
[im 65/71]
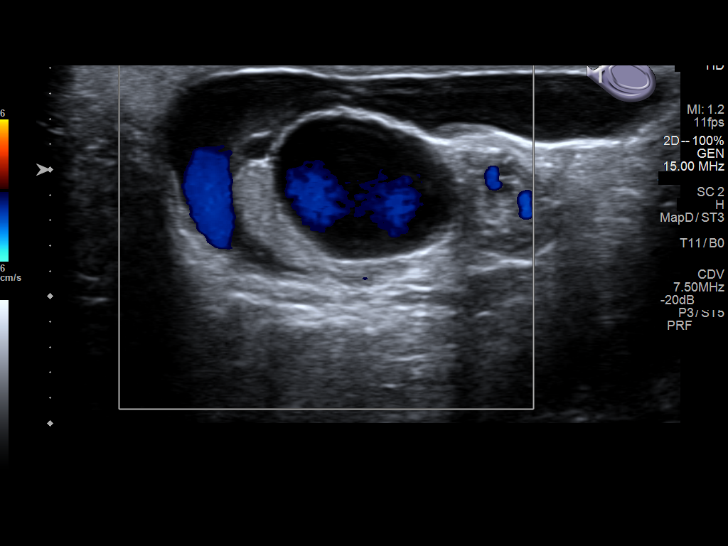
[im 71/71]
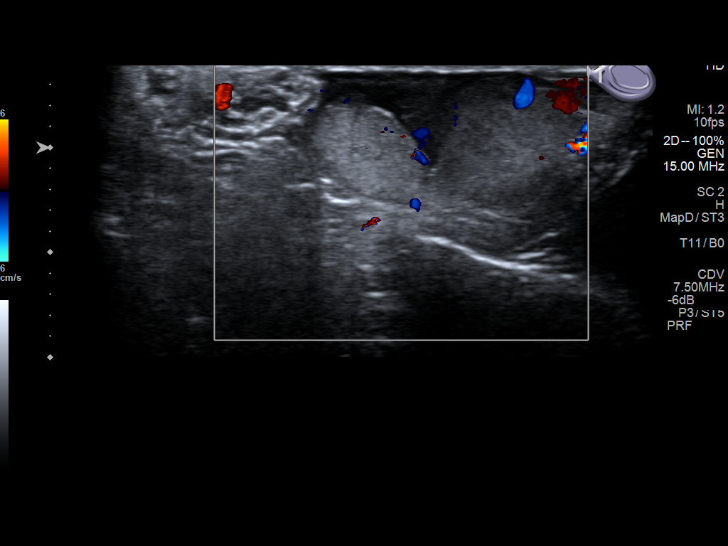

[14 of 25 positions shown; findings below may reference images not displayed]

FINDINGS: Right testicle

Measurements: 4.5 x 2.1 x 3.1 cm. No mass or microlithiasis
visualized.

Left testicle

Measurements: 4.5 x 1.19 x 3.4 cm. No mass or microlithiasis
visualized.

Right epididymis: Epididymal head cyst measuring 1.4 x 1.4 x 1.2 cm.
Normal in size and appearance.

Left epididymis:  Normal in size and appearance.

Hydrocele:  Trace bilateral hydroceles.

Varicocele:  None visualized.

Pulsed Doppler interrogation of both testes demonstrates normal low
resistance arterial and venous waveforms bilaterally.
IMPRESSION: 1. Right 1.4 cm epididymal head cyst which may correlate to palpable
right upper testicular lesion on physical exam.
2. Otherwise unremarkable scrotal ultrasound.

## 2023-01-05 ENCOUNTER — Encounter: Payer: Self-pay | Admitting: Physician Assistant

## 2023-01-05 ENCOUNTER — Ambulatory Visit: Payer: Self-pay | Admitting: Physician Assistant

## 2023-01-05 DIAGNOSIS — Z113 Encounter for screening for infections with a predominantly sexual mode of transmission: Secondary | ICD-10-CM

## 2023-01-05 LAB — HM HEPATITIS C SCREENING LAB: HM Hepatitis Screen: NEGATIVE

## 2023-01-05 LAB — HM HIV SCREENING LAB: HM HIV Screening: NEGATIVE

## 2023-01-05 LAB — HEPATITIS B SURFACE ANTIGEN

## 2023-01-05 NOTE — Progress Notes (Signed)
[facility] Department STI clinic/screening visit  Subjective:  Trevor Bennett is a 27 y.o. male being seen today for an STI screening visit. The patient reports they do not have symptoms.    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 not taking now.  11/17/2020   Syphilis 11/07/2017     Chief Complaint  Patient presents with   SEXUALLY TRANSMITTED DISEASE    27 yo man here for STI testing. Asymptomatic.    Patient reports h/o rectal chlamydia diagosted/treated in [place] 2 mo ago.  Last HIV test per patient/review of record was  Lab Results  Component Value Date   HMHIVSCREEN Negative - Validated 09/10/2021   No results found for: "HIV"  Does the patient or their partner desires a pregnancy in the next year? No  Screening for MPX risk: Does the patient have an unexplained rash? No Is the patient MSM? Yes Does the patient endorse multiple sex partners or anonymous sex partners? Yes Did the patient have close or sexual contact with a person diagnosed with MPX? No Has the patient traveled outside the [place] where MPX is endemic? No Is there a high clinical suspicion for MPX-- evidenced by one of the following No  -Unlikely to be chickenpox  -Lymphadenopathy  -Rash that present in same phase of evolution on any given body part   See flowsheet for further details and programmatic requirements.   Immunization History  Administered Date(s) Administered   Hepatitis A 10/02/2006, 03/20/2008   PFIZER(Purple Top)SARS-COV-2 Vaccination 11/14/2019, 12/05/2019   Tdap 03/20/2008     The following portions of the patient's history were reviewed and updated as appropriate: allergies, current medications, past medical history, past  social history, past surgical history and problem list.  Objective:  There were no vitals filed for this visit.  Physical Exam Vitals and nursing note reviewed.  Constitutional:      Appearance: Normal appearance.  HENT:     Head: Normocephalic and atraumatic.     Mouth/Throat:     Mouth: Mucous membranes are moist.     Pharynx: No oropharyngeal exudate or posterior oropharyngeal erythema.  Eyes:     General:        Right eye: No discharge.        Left eye: No discharge.     Conjunctiva/sclera:     Right eye: Right conjunctiva is not injected. No exudate.    Left eye: Left conjunctiva is not injected. No exudate. Pulmonary:     Effort: Pulmonary effort is normal.  Abdominal:     General: Abdomen is flat.     Palpations: Abdomen is soft. There is no hepatomegaly or mass.     Tenderness: There is no abdominal tenderness. There is no rebound.  Genitourinary:    Comments: Declined genital exam- asymptomatic Lymphadenopathy:     Cervical: No cervical adenopathy.     Upper Body:     Right upper body: No supraclavicular or axillary adenopathy.     Left upper body: No supraclavicular or axillary adenopathy.  Skin:    General: Skin is warm and dry.     Comments: Multiple tattoos noted  Neurological:     Mental Status: He is alert and oriented to person, place, and time.  Assessment and Plan:  Trevor Bennett is a 27 y.o. male presenting to the [facility] for STI screening  1. Screening for STD (sexually transmitted disease) Await test results. Referred to [facility] for PreP at pt request. Plan to offer Mpox vaccine at next visit. - Chlamydia/GC NAA, Confirmation -urine - Gonococcus culture - rectal - HIV/HCV [facility] - HBV Antigen/Antibody [facility] - Syphilis Serology, [facility]   Patient does not have STI symptoms Patient accepted all screenings including  urine GC/Chlamydia, and blood work for HIV/Syphilis. Patient  meets criteria for HepB screening? Yes. Ordered? yes Patient meets criteria for HepC screening? Yes. Ordered? yes Recommended condom use with all sex Discussed importance of condom use for STI prevent  Treat positive test results per standing order. Discussed time line for [facility] results and that patient will be called with positive results and encouraged patient to call if he had not heard in 2 weeks Recommended repeat testing in 3 months with positive results. Recommended returning for continued or worsening symptoms.   Return in about 6 months (around 07/07/2023) for STI screening.  No future appointments.  Landry Dyke, PA-C

## 2023-01-05 NOTE — Progress Notes (Signed)
Here for STD testing. No symptoms.Burt Knack, RN

## 2023-01-05 NOTE — Progress Notes (Signed)
Information booklet about Prep given to patient along with PCP list. Patient counseled to call [facility] to schedule appointment for Prep and appropriate blood work. Patient states understanding.Burt Knack, RN

## 2023-01-10 LAB — GONOCOCCUS CULTURE

## 2023-01-10 LAB — CHLAMYDIA/GC NAA, CONFIRMATION
Chlamydia trachomatis, NAA: NEGATIVE
Neisseria gonorrhoeae, NAA: NEGATIVE

## 2023-01-18 NOTE — Addendum Note (Signed)
Addended by: Berdie Ogren on: 01/18/2023 02:08 PM   Modules accepted: Orders
